# Patient Record
Sex: Male | Born: 1980 | Race: White | Hispanic: No | Marital: Single | Smoking: Current every day smoker
Health system: Southern US, Community
[De-identification: ages and names within clinical notes are randomized; demographics above are authoritative.]

---

## 2008-09-12 ENCOUNTER — Emergency Department: Payer: Self-pay | Admitting: Emergency Medicine

## 2008-10-20 ENCOUNTER — Emergency Department: Payer: Self-pay | Admitting: Emergency Medicine

## 2009-03-06 ENCOUNTER — Emergency Department: Payer: Self-pay | Admitting: Emergency Medicine

## 2009-03-15 ENCOUNTER — Emergency Department: Payer: Self-pay | Admitting: Emergency Medicine

## 2009-07-08 ENCOUNTER — Emergency Department: Payer: Self-pay | Admitting: Emergency Medicine

## 2009-07-26 ENCOUNTER — Emergency Department: Payer: Self-pay | Admitting: Internal Medicine

## 2009-11-20 ENCOUNTER — Emergency Department: Payer: Self-pay | Admitting: Emergency Medicine

## 2009-11-28 ENCOUNTER — Emergency Department: Payer: Self-pay | Admitting: Emergency Medicine

## 2010-08-04 ENCOUNTER — Emergency Department: Payer: Self-pay | Admitting: Unknown Physician Specialty

## 2010-10-26 ENCOUNTER — Emergency Department: Payer: Self-pay | Admitting: Emergency Medicine

## 2010-10-29 ENCOUNTER — Emergency Department: Payer: Self-pay | Admitting: Emergency Medicine

## 2010-12-13 ENCOUNTER — Emergency Department: Payer: Self-pay | Admitting: Emergency Medicine

## 2011-08-27 ENCOUNTER — Inpatient Hospital Stay: Payer: Self-pay | Admitting: Psychiatry

## 2011-09-12 ENCOUNTER — Emergency Department: Payer: Self-pay | Admitting: Unknown Physician Specialty

## 2011-12-06 ENCOUNTER — Emergency Department: Payer: Self-pay | Admitting: *Deleted

## 2012-01-29 ENCOUNTER — Emergency Department: Payer: Self-pay | Admitting: Internal Medicine

## 2012-01-29 LAB — URINALYSIS, COMPLETE
Bacteria: NONE SEEN
Bilirubin,UR: NEGATIVE
Hyaline Cast: 3
Ketone: NEGATIVE
Leukocyte Esterase: NEGATIVE
Nitrite: NEGATIVE
Ph: 5 (ref 4.5–8.0)
RBC,UR: 1 /HPF (ref 0–5)
Specific Gravity: 1.027 (ref 1.003–1.030)
WBC UR: 1 /HPF (ref 0–5)

## 2012-01-29 LAB — COMPREHENSIVE METABOLIC PANEL
Albumin: 2.9 g/dL — ABNORMAL LOW (ref 3.4–5.0)
Anion Gap: 11 (ref 7–16)
BUN: 11 mg/dL (ref 7–18)
Bilirubin,Total: 1.3 mg/dL — ABNORMAL HIGH (ref 0.2–1.0)
Creatinine: 1.19 mg/dL (ref 0.60–1.30)
EGFR (African American): >60
Glucose: 146 mg/dL — ABNORMAL HIGH (ref 65–99)
Osmolality: 283 (ref 275–301)
Potassium: 3.8 mmol/L (ref 3.5–5.1)
SGOT(AST): 324 U/L — ABNORMAL HIGH (ref 15–37)
SGPT (ALT): 219 U/L — ABNORMAL HIGH
Total Protein: 6.4 g/dL (ref 6.4–8.2)

## 2012-01-29 LAB — CBC
HCT: 46.2 % (ref 40.0–52.0)
HGB: 15.7 g/dL (ref 13.0–18.0)
MCH: 31.7 pg (ref 26.0–34.0)
MCHC: 34 g/dL (ref 32.0–36.0)
MCV: 93 fL (ref 80–100)
Platelet: 169 10*3/uL (ref 150–440)
RDW: 15.4 % — ABNORMAL HIGH (ref 11.5–14.5)
WBC: 3.9 10*3/uL (ref 3.8–10.6)

## 2012-02-22 ENCOUNTER — Emergency Department: Payer: Self-pay | Admitting: Emergency Medicine

## 2012-02-22 LAB — CBC
HGB: 14.1 g/dL (ref 13.0–18.0)
MCH: 31.7 pg (ref 26.0–34.0)
MCHC: 34.1 g/dL (ref 32.0–36.0)
MCV: 93 fL (ref 80–100)
Platelet: 138 10*3/uL — ABNORMAL LOW (ref 150–440)
RBC: 4.46 10*6/uL (ref 4.40–5.90)
RDW: 17 % — ABNORMAL HIGH (ref 11.5–14.5)

## 2012-02-22 LAB — COMPREHENSIVE METABOLIC PANEL
Albumin: 2.4 g/dL — ABNORMAL LOW (ref 3.4–5.0)
Alkaline Phosphatase: 778 U/L — ABNORMAL HIGH (ref 50–136)
Anion Gap: 10 (ref 7–16)
BUN: 12 mg/dL (ref 7–18)
Bilirubin,Total: 10.5 mg/dL — ABNORMAL HIGH (ref 0.2–1.0)
Calcium, Total: 8.3 mg/dL — ABNORMAL LOW (ref 8.5–10.1)
Glucose: 113 mg/dL — ABNORMAL HIGH (ref 65–99)
Osmolality: 269 (ref 275–301)
SGOT(AST): 216 U/L — ABNORMAL HIGH (ref 15–37)
SGPT (ALT): 116 U/L — ABNORMAL HIGH
Total Protein: 6.2 g/dL — ABNORMAL LOW (ref 6.4–8.2)

## 2012-02-22 LAB — APTT: Activated PTT: 32.5 secs (ref 23.6–35.9)

## 2012-02-22 LAB — LIPASE, BLOOD: Lipase: 165 U/L (ref 73–393)

## 2012-02-22 LAB — PROTIME-INR: Prothrombin Time: 12.5 secs (ref 11.5–14.7)

## 2012-02-22 LAB — CARBAMAZEPINE LEVEL, TOTAL: Carbamazepine: 12.9 ug/mL (ref 4.0–12.0)

## 2012-05-01 ENCOUNTER — Emergency Department: Payer: Self-pay | Admitting: Emergency Medicine

## 2012-05-01 LAB — URINALYSIS, COMPLETE
Blood: NEGATIVE
Granular Cast: 3
Hyaline Cast: 1
Leukocyte Esterase: NEGATIVE
Nitrite: NEGATIVE
Protein: NEGATIVE
RBC,UR: <1 /HPF (ref 0–5)
Specific Gravity: 1.027 (ref 1.003–1.030)
Squamous Epithelial: NONE SEEN
WBC UR: <1 /HPF (ref 0–5)

## 2012-05-01 LAB — CBC
HGB: 12.9 g/dL — ABNORMAL LOW (ref 13.0–18.0)
MCH: 36.5 pg — ABNORMAL HIGH (ref 26.0–34.0)
MCHC: 34.4 g/dL (ref 32.0–36.0)
MCV: 106 fL — ABNORMAL HIGH (ref 80–100)
RBC: 3.53 10*6/uL — ABNORMAL LOW (ref 4.40–5.90)
RDW: 15.7 % — ABNORMAL HIGH (ref 11.5–14.5)
WBC: 6.9 10*3/uL (ref 3.8–10.6)

## 2012-05-01 LAB — COMPREHENSIVE METABOLIC PANEL
Alkaline Phosphatase: 632 U/L — ABNORMAL HIGH (ref 50–136)
Anion Gap: 8 (ref 7–16)
BUN: 6 mg/dL — ABNORMAL LOW (ref 7–18)
Calcium, Total: 7.6 mg/dL — ABNORMAL LOW (ref 8.5–10.1)
Chloride: 103 mmol/L (ref 98–107)
Creatinine: 1.08 mg/dL (ref 0.60–1.30)
EGFR (Non-African Amer.): >60
Osmolality: 273 (ref 275–301)
Potassium: 4 mmol/L (ref 3.5–5.1)
SGOT(AST): 148 U/L — ABNORMAL HIGH (ref 15–37)
SGPT (ALT): 53 U/L
Sodium: 137 mmol/L (ref 136–145)
Total Protein: 6.6 g/dL (ref 6.4–8.2)

## 2012-05-10 ENCOUNTER — Emergency Department: Payer: Self-pay | Admitting: Internal Medicine

## 2012-05-11 ENCOUNTER — Emergency Department: Payer: Self-pay | Admitting: Emergency Medicine

## 2012-05-11 LAB — CBC
HCT: 35.9 % — ABNORMAL LOW (ref 40.0–52.0)
HGB: 12 g/dL — ABNORMAL LOW (ref 13.0–18.0)
RDW: 14.7 % — ABNORMAL HIGH (ref 11.5–14.5)
WBC: 5.7 10*3/uL (ref 3.8–10.6)

## 2012-05-11 LAB — COMPREHENSIVE METABOLIC PANEL
Albumin: 1.9 g/dL — ABNORMAL LOW (ref 3.4–5.0)
Alkaline Phosphatase: 341 U/L — ABNORMAL HIGH (ref 50–136)
Bilirubin,Total: 2 mg/dL — ABNORMAL HIGH (ref 0.2–1.0)
Calcium, Total: 7.7 mg/dL — ABNORMAL LOW (ref 8.5–10.1)
Co2: 29 mmol/L (ref 21–32)
Creatinine: 0.9 mg/dL (ref 0.60–1.30)
EGFR (Non-African Amer.): >60
Glucose: 89 mg/dL (ref 65–99)
SGOT(AST): 53 U/L — ABNORMAL HIGH (ref 15–37)
Sodium: 140 mmol/L (ref 136–145)
Total Protein: 6.2 g/dL — ABNORMAL LOW (ref 6.4–8.2)

## 2012-05-11 LAB — URINALYSIS, COMPLETE
Bilirubin,UR: NEGATIVE
Ketone: NEGATIVE
Nitrite: NEGATIVE
Ph: 5 (ref 4.5–8.0)
RBC,UR: NONE SEEN /HPF (ref 0–5)
Specific Gravity: 1.02 (ref 1.003–1.030)
Squamous Epithelial: NONE SEEN

## 2012-05-11 LAB — LIPASE, BLOOD: Lipase: 295 U/L (ref 73–393)

## 2012-12-01 ENCOUNTER — Emergency Department: Payer: Self-pay | Admitting: Emergency Medicine

## 2012-12-12 ENCOUNTER — Emergency Department: Payer: Self-pay | Admitting: Emergency Medicine

## 2012-12-12 LAB — COMPREHENSIVE METABOLIC PANEL
Albumin: 3.7 g/dL (ref 3.4–5.0)
Alkaline Phosphatase: 225 U/L — ABNORMAL HIGH (ref 50–136)
Bilirubin,Total: 0.7 mg/dL (ref 0.2–1.0)
SGPT (ALT): 57 U/L (ref 12–78)
Sodium: 140 mmol/L (ref 136–145)
Total Protein: 8.3 g/dL — ABNORMAL HIGH (ref 6.4–8.2)

## 2012-12-12 LAB — DRUG SCREEN, URINE
Barbiturates, Ur Screen: NEGATIVE (ref ?–200)
Cannabinoid 50 Ng, Ur ~~LOC~~: NEGATIVE (ref ?–50)
Cocaine Metabolite,Ur ~~LOC~~: NEGATIVE (ref ?–300)
MDMA (Ecstasy)Ur Screen: NEGATIVE (ref ?–500)
Methadone, Ur Screen: NEGATIVE (ref ?–300)
Opiate, Ur Screen: NEGATIVE (ref ?–300)
Phencyclidine (PCP) Ur S: NEGATIVE (ref ?–25)

## 2012-12-12 LAB — TSH: Thyroid Stimulating Horm: 1.22 u[IU]/mL

## 2012-12-12 LAB — CBC
MCH: 32.1 pg (ref 26.0–34.0)
MCHC: 33.8 g/dL (ref 32.0–36.0)
MCV: 95 fL (ref 80–100)
Platelet: 87 10*3/uL — ABNORMAL LOW (ref 150–440)
RDW: 13.7 % (ref 11.5–14.5)
WBC: 5.1 10*3/uL (ref 3.8–10.6)

## 2012-12-12 LAB — SALICYLATE LEVEL: Salicylates, Serum: <1.7 mg/dL

## 2012-12-12 LAB — ETHANOL: Ethanol: 401 mg/dL

## 2012-12-12 LAB — ACETAMINOPHEN LEVEL: Acetaminophen: <2 ug/mL

## 2012-12-13 ENCOUNTER — Emergency Department: Payer: Self-pay | Admitting: Emergency Medicine

## 2012-12-13 LAB — CBC
HGB: 15 g/dL (ref 13.0–18.0)
MCV: 96 fL (ref 80–100)
Platelet: 81 10*3/uL — ABNORMAL LOW (ref 150–440)
RBC: 4.53 10*6/uL (ref 4.40–5.90)
WBC: 4.6 10*3/uL (ref 3.8–10.6)

## 2012-12-13 LAB — DRUG SCREEN, URINE
Amphetamines, Ur Screen: NEGATIVE (ref ?–1000)
Barbiturates, Ur Screen: NEGATIVE (ref ?–200)
Cannabinoid 50 Ng, Ur ~~LOC~~: NEGATIVE (ref ?–50)
Cocaine Metabolite,Ur ~~LOC~~: NEGATIVE (ref ?–300)
MDMA (Ecstasy)Ur Screen: NEGATIVE (ref ?–500)
Opiate, Ur Screen: NEGATIVE (ref ?–300)
Phencyclidine (PCP) Ur S: NEGATIVE (ref ?–25)

## 2012-12-13 LAB — COMPREHENSIVE METABOLIC PANEL
Anion Gap: 10 (ref 7–16)
Calcium, Total: 8.5 mg/dL (ref 8.5–10.1)
Chloride: 102 mmol/L (ref 98–107)
Co2: 27 mmol/L (ref 21–32)
EGFR (Non-African Amer.): >60
Osmolality: 275 (ref 275–301)
Potassium: 3.7 mmol/L (ref 3.5–5.1)
Sodium: 139 mmol/L (ref 136–145)

## 2012-12-13 LAB — ETHANOL
Ethanol %: 0.089 % — ABNORMAL HIGH (ref 0.000–0.080)
Ethanol: 89 mg/dL

## 2012-12-13 LAB — ACETAMINOPHEN LEVEL: Acetaminophen: <2 ug/mL

## 2012-12-13 LAB — TSH: Thyroid Stimulating Horm: 0.826 u[IU]/mL

## 2012-12-14 ENCOUNTER — Emergency Department: Payer: Self-pay | Admitting: Emergency Medicine

## 2013-02-08 ENCOUNTER — Emergency Department: Payer: Self-pay | Admitting: Internal Medicine

## 2013-02-08 LAB — COMPREHENSIVE METABOLIC PANEL
Albumin: 4.3 g/dL (ref 3.4–5.0)
Alkaline Phosphatase: 156 U/L — ABNORMAL HIGH (ref 50–136)
Bilirubin,Total: 1.1 mg/dL — ABNORMAL HIGH (ref 0.2–1.0)
Chloride: 101 mmol/L (ref 98–107)
Creatinine: 0.78 mg/dL (ref 0.60–1.30)
EGFR (African American): >60
Osmolality: 270 (ref 275–301)
SGPT (ALT): 31 U/L (ref 12–78)
Total Protein: 8.6 g/dL — ABNORMAL HIGH (ref 6.4–8.2)

## 2013-02-08 LAB — DRUG SCREEN, URINE
Amphetamines, Ur Screen: NEGATIVE (ref ?–1000)
Cannabinoid 50 Ng, Ur ~~LOC~~: NEGATIVE (ref ?–50)
MDMA (Ecstasy)Ur Screen: NEGATIVE (ref ?–500)
Methadone, Ur Screen: NEGATIVE (ref ?–300)
Opiate, Ur Screen: NEGATIVE (ref ?–300)
Phencyclidine (PCP) Ur S: NEGATIVE (ref ?–25)

## 2013-02-08 LAB — CBC
MCH: 32.7 pg (ref 26.0–34.0)
MCHC: 34 g/dL (ref 32.0–36.0)
RBC: 4.84 10*6/uL (ref 4.40–5.90)
WBC: 10 10*3/uL (ref 3.8–10.6)

## 2013-02-08 LAB — SALICYLATE LEVEL: Salicylates, Serum: 2.4 mg/dL

## 2013-02-08 LAB — ETHANOL: Ethanol: <3 mg/dL

## 2013-05-29 ENCOUNTER — Emergency Department: Payer: Self-pay | Admitting: Internal Medicine

## 2013-05-29 LAB — COMPREHENSIVE METABOLIC PANEL
Albumin: 4 g/dL (ref 3.4–5.0)
Alkaline Phosphatase: 246 U/L — ABNORMAL HIGH (ref 50–136)
BUN: 6 mg/dL — ABNORMAL LOW (ref 7–18)
Bilirubin,Total: 1.1 mg/dL — ABNORMAL HIGH (ref 0.2–1.0)
Calcium, Total: 9 mg/dL (ref 8.5–10.1)
Chloride: 103 mmol/L (ref 98–107)
Co2: 26 mmol/L (ref 21–32)
Creatinine: 0.62 mg/dL (ref 0.60–1.30)
EGFR (African American): >60
Glucose: 101 mg/dL — ABNORMAL HIGH (ref 65–99)
SGOT(AST): 88 U/L — ABNORMAL HIGH (ref 15–37)
Sodium: 137 mmol/L (ref 136–145)
Total Protein: 8.4 g/dL — ABNORMAL HIGH (ref 6.4–8.2)

## 2013-05-29 LAB — DRUG SCREEN, URINE
Amphetamines, Ur Screen: NEGATIVE (ref ?–1000)
Barbiturates, Ur Screen: NEGATIVE (ref ?–200)
Benzodiazepine, Ur Scrn: NEGATIVE (ref ?–200)
Cannabinoid 50 Ng, Ur ~~LOC~~: NEGATIVE (ref ?–50)
MDMA (Ecstasy)Ur Screen: NEGATIVE (ref ?–500)
Opiate, Ur Screen: NEGATIVE (ref ?–300)
Tricyclic, Ur Screen: NEGATIVE (ref ?–1000)

## 2013-05-29 LAB — TSH: Thyroid Stimulating Horm: 1.05 u[IU]/mL

## 2013-05-29 LAB — CBC
HCT: 45.6 % (ref 40.0–52.0)
HGB: 15.8 g/dL (ref 13.0–18.0)
MCH: 33 pg (ref 26.0–34.0)
MCV: 96 fL (ref 80–100)
Platelet: 135 10*3/uL — ABNORMAL LOW (ref 150–440)
RBC: 4.78 10*6/uL (ref 4.40–5.90)
RDW: 13.7 % (ref 11.5–14.5)
WBC: 8.9 10*3/uL (ref 3.8–10.6)

## 2013-05-29 LAB — ETHANOL
Ethanol %: 0.068 % (ref 0.000–0.080)
Ethanol: 68 mg/dL

## 2013-05-29 LAB — ACETAMINOPHEN LEVEL: Acetaminophen: <2 ug/mL

## 2013-05-29 LAB — SALICYLATE LEVEL: Salicylates, Serum: 3.4 mg/dL — ABNORMAL HIGH

## 2013-05-30 LAB — ETHANOL: Ethanol %: <0.003 % (ref 0.000–0.080)

## 2013-05-30 LAB — DRUG SCREEN, URINE
Amphetamines, Ur Screen: NEGATIVE (ref ?–1000)
Barbiturates, Ur Screen: NEGATIVE (ref ?–200)
Cannabinoid 50 Ng, Ur ~~LOC~~: NEGATIVE (ref ?–50)
MDMA (Ecstasy)Ur Screen: NEGATIVE (ref ?–500)
Methadone, Ur Screen: NEGATIVE (ref ?–300)
Opiate, Ur Screen: NEGATIVE (ref ?–300)

## 2013-05-30 LAB — CBC
HCT: 45.1 % (ref 40.0–52.0)
MCHC: 34.1 g/dL (ref 32.0–36.0)
Platelet: 143 10*3/uL — ABNORMAL LOW (ref 150–440)
WBC: 8.2 10*3/uL (ref 3.8–10.6)

## 2013-05-30 LAB — COMPREHENSIVE METABOLIC PANEL
Albumin: 4.1 g/dL (ref 3.4–5.0)
Alkaline Phosphatase: 212 U/L — ABNORMAL HIGH (ref 50–136)
BUN: 7 mg/dL (ref 7–18)
Calcium, Total: 9.5 mg/dL (ref 8.5–10.1)
EGFR (African American): >60
EGFR (Non-African Amer.): >60
SGOT(AST): 65 U/L — ABNORMAL HIGH (ref 15–37)
SGPT (ALT): 62 U/L (ref 12–78)
Total Protein: 8.2 g/dL (ref 6.4–8.2)

## 2013-05-30 LAB — URINALYSIS, COMPLETE
Bilirubin,UR: NEGATIVE
Glucose,UR: NEGATIVE mg/dL (ref 0–75)
Nitrite: NEGATIVE
Ph: 5 (ref 4.5–8.0)
RBC,UR: 1 /HPF (ref 0–5)
Squamous Epithelial: NONE SEEN
WBC UR: 2 /HPF (ref 0–5)

## 2013-05-31 LAB — BASIC METABOLIC PANEL
Anion Gap: 5 — ABNORMAL LOW (ref 7–16)
BUN: 8 mg/dL (ref 7–18)
Calcium, Total: 9.2 mg/dL (ref 8.5–10.1)
Chloride: 106 mmol/L (ref 98–107)
Co2: 28 mmol/L (ref 21–32)
Creatinine: 0.81 mg/dL (ref 0.60–1.30)
EGFR (African American): >60
EGFR (Non-African Amer.): >60
Glucose: 86 mg/dL (ref 65–99)
Osmolality: 275 (ref 275–301)
Potassium: 4.1 mmol/L (ref 3.5–5.1)
Sodium: 139 mmol/L (ref 136–145)

## 2013-06-01 ENCOUNTER — Inpatient Hospital Stay: Payer: Self-pay | Admitting: Internal Medicine

## 2013-06-02 LAB — BASIC METABOLIC PANEL WITH GFR
Anion Gap: 7
BUN: 9 mg/dL
Calcium, Total: 8.7 mg/dL
Chloride: 103 mmol/L
Co2: 28 mmol/L
Creatinine: 0.94 mg/dL
EGFR (African American): >60
EGFR (Non-African Amer.): >60
Glucose: 160 mg/dL — ABNORMAL HIGH
Osmolality: 278
Potassium: 3.8 mmol/L
Sodium: 138 mmol/L

## 2013-06-02 LAB — CBC WITH DIFFERENTIAL/PLATELET
Basophil #: 0.1 x10 3/mm 3
Basophil %: 1.3 %
Eosinophil #: 0.3 x10 3/mm 3
Eosinophil %: 2.7 %
HCT: 46.8 %
HGB: 15.8 g/dL
Lymphocyte %: 10.3 %
Lymphs Abs: 1.2 x10 3/mm 3
MCH: 32.9 pg
MCHC: 33.7 g/dL
MCV: 98 fL
Monocyte #: 1.5 "x10 3/mm " — ABNORMAL HIGH
Monocyte %: 13 %
Neutrophil #: 8.3 x10 3/mm 3 — ABNORMAL HIGH
Neutrophil %: 72.7 %
Platelet: 140 x10 3/mm 3 — ABNORMAL LOW
RBC: 4.8 x10 6/mm 3
RDW: 13.2 %
WBC: 11.3 x10 3/mm 3 — ABNORMAL HIGH

## 2013-06-02 LAB — COMPREHENSIVE METABOLIC PANEL WITH GFR
Albumin: 3.5 g/dL
Alkaline Phosphatase: 160 U/L — ABNORMAL HIGH
Bilirubin,Total: 1.2 mg/dL — ABNORMAL HIGH
SGOT(AST): 45 U/L — ABNORMAL HIGH
SGPT (ALT): 43 U/L
Total Protein: 7.2 g/dL

## 2013-06-02 LAB — PROTIME-INR
INR: 1.1
Prothrombin Time: 14.2 secs (ref 11.5–14.7)

## 2013-06-02 LAB — AMMONIA: Ammonia, Plasma: 45 umol/L — ABNORMAL HIGH

## 2013-06-04 ENCOUNTER — Emergency Department: Payer: Self-pay | Admitting: Emergency Medicine

## 2013-06-04 LAB — COMPREHENSIVE METABOLIC PANEL
Alkaline Phosphatase: 193 U/L — ABNORMAL HIGH (ref 50–136)
Anion Gap: 3 — ABNORMAL LOW (ref 7–16)
BUN: 9 mg/dL (ref 7–18)
Bilirubin,Total: 0.5 mg/dL (ref 0.2–1.0)
Calcium, Total: 9.1 mg/dL (ref 8.5–10.1)
Chloride: 103 mmol/L (ref 98–107)
Co2: 29 mmol/L (ref 21–32)
Creatinine: 0.88 mg/dL (ref 0.60–1.30)
EGFR (Non-African Amer.): >60
Osmolality: 271 (ref 275–301)
Potassium: 4 mmol/L (ref 3.5–5.1)
SGOT(AST): 43 U/L — ABNORMAL HIGH (ref 15–37)
SGPT (ALT): 51 U/L (ref 12–78)
Total Protein: 7.5 g/dL (ref 6.4–8.2)

## 2013-06-04 LAB — URINALYSIS, COMPLETE
Blood: NEGATIVE
Ketone: NEGATIVE
Leukocyte Esterase: NEGATIVE
Specific Gravity: 1.029 (ref 1.003–1.030)

## 2013-06-04 LAB — DRUG SCREEN, URINE
Barbiturates, Ur Screen: NEGATIVE (ref ?–200)
Cannabinoid 50 Ng, Ur ~~LOC~~: NEGATIVE (ref ?–50)
Cocaine Metabolite,Ur ~~LOC~~: NEGATIVE (ref ?–300)
MDMA (Ecstasy)Ur Screen: NEGATIVE (ref ?–500)
Opiate, Ur Screen: NEGATIVE (ref ?–300)
Phencyclidine (PCP) Ur S: NEGATIVE (ref ?–25)
Tricyclic, Ur Screen: NEGATIVE (ref ?–1000)

## 2013-06-04 LAB — CBC
HCT: 45 % (ref 40.0–52.0)
HGB: 15.1 g/dL (ref 13.0–18.0)
MCH: 33 pg (ref 26.0–34.0)
MCHC: 33.6 g/dL (ref 32.0–36.0)
MCV: 98 fL (ref 80–100)

## 2013-06-04 LAB — ETHANOL: Ethanol: <3 mg/dL

## 2013-06-23 ENCOUNTER — Emergency Department: Payer: Self-pay | Admitting: Emergency Medicine

## 2013-07-17 ENCOUNTER — Emergency Department: Payer: Self-pay | Admitting: Emergency Medicine

## 2013-08-12 ENCOUNTER — Emergency Department: Payer: Self-pay | Admitting: Emergency Medicine

## 2013-08-12 LAB — DRUG SCREEN, URINE
Benzodiazepine, Ur Scrn: NEGATIVE (ref ?–200)
Cannabinoid 50 Ng, Ur ~~LOC~~: NEGATIVE (ref ?–50)
Cocaine Metabolite,Ur ~~LOC~~: NEGATIVE (ref ?–300)
MDMA (Ecstasy)Ur Screen: NEGATIVE (ref ?–500)
Methadone, Ur Screen: NEGATIVE (ref ?–300)
Opiate, Ur Screen: NEGATIVE (ref ?–300)
Tricyclic, Ur Screen: NEGATIVE (ref ?–1000)

## 2013-08-12 LAB — ETHANOL: Ethanol: 367 mg/dL

## 2013-08-12 LAB — COMPREHENSIVE METABOLIC PANEL
Albumin: 4.5 g/dL (ref 3.4–5.0)
Alkaline Phosphatase: 190 U/L — ABNORMAL HIGH (ref 50–136)
BUN: 4 mg/dL — ABNORMAL LOW (ref 7–18)
Chloride: 103 mmol/L (ref 98–107)
Creatinine: 0.86 mg/dL (ref 0.60–1.30)
EGFR (Non-African Amer.): >60
Glucose: 102 mg/dL — ABNORMAL HIGH (ref 65–99)
Osmolality: 276 (ref 275–301)
SGOT(AST): 70 U/L — ABNORMAL HIGH (ref 15–37)
SGPT (ALT): 50 U/L (ref 12–78)
Sodium: 140 mmol/L (ref 136–145)

## 2013-08-12 LAB — URINALYSIS, COMPLETE
Bilirubin,UR: NEGATIVE
Blood: NEGATIVE
Glucose,UR: NEGATIVE mg/dL (ref 0–75)
Leukocyte Esterase: NEGATIVE
Protein: NEGATIVE
Specific Gravity: 1.005 (ref 1.003–1.030)
WBC UR: <1 /HPF (ref 0–5)

## 2013-08-12 LAB — CBC
MCV: 92 fL (ref 80–100)
Platelet: 269 10*3/uL (ref 150–440)
RBC: 5.61 10*6/uL (ref 4.40–5.90)
RDW: 13.7 % (ref 11.5–14.5)
WBC: 7.8 10*3/uL (ref 3.8–10.6)

## 2013-08-13 LAB — ETHANOL: Ethanol: 164 mg/dL

## 2013-09-29 ENCOUNTER — Emergency Department: Payer: Self-pay | Admitting: Emergency Medicine

## 2013-10-05 ENCOUNTER — Emergency Department: Payer: Self-pay | Admitting: Emergency Medicine

## 2013-10-05 LAB — URINALYSIS, COMPLETE
Bilirubin,UR: NEGATIVE
Blood: NEGATIVE
Nitrite: NEGATIVE
Protein: NEGATIVE
WBC UR: <1 /HPF (ref 0–5)

## 2013-10-05 LAB — COMPREHENSIVE METABOLIC PANEL
Alkaline Phosphatase: 239 U/L — ABNORMAL HIGH (ref 50–136)
Anion Gap: 4 — ABNORMAL LOW (ref 7–16)
BUN: 7 mg/dL (ref 7–18)
Bilirubin,Total: 0.4 mg/dL (ref 0.2–1.0)
Calcium, Total: 9.1 mg/dL (ref 8.5–10.1)
Chloride: 102 mmol/L (ref 98–107)
Co2: 30 mmol/L (ref 21–32)
Creatinine: 0.67 mg/dL (ref 0.60–1.30)
EGFR (African American): >60
EGFR (Non-African Amer.): >60
Glucose: 107 mg/dL — ABNORMAL HIGH (ref 65–99)
SGOT(AST): 70 U/L — ABNORMAL HIGH (ref 15–37)
SGPT (ALT): 55 U/L (ref 12–78)

## 2013-10-05 LAB — ETHANOL
Ethanol %: 0.215 % — ABNORMAL HIGH (ref 0.000–0.080)
Ethanol: 215 mg/dL

## 2013-10-05 LAB — DRUG SCREEN, URINE

## 2013-10-05 LAB — CBC
HCT: 47.6 % (ref 40.0–52.0)
MCHC: 34.4 g/dL (ref 32.0–36.0)
Platelet: 181 10*3/uL (ref 150–440)
RBC: 5.08 10*6/uL (ref 4.40–5.90)
RDW: 14.4 % (ref 11.5–14.5)

## 2013-10-17 ENCOUNTER — Inpatient Hospital Stay: Payer: Self-pay | Admitting: Psychiatry

## 2013-10-17 LAB — DRUG SCREEN, URINE
Barbiturates, Ur Screen: NEGATIVE (ref ?–200)
Benzodiazepine, Ur Scrn: NEGATIVE (ref ?–200)
Cannabinoid 50 Ng, Ur ~~LOC~~: NEGATIVE (ref ?–50)
Cocaine Metabolite,Ur ~~LOC~~: NEGATIVE (ref ?–300)
MDMA (Ecstasy)Ur Screen: NEGATIVE (ref ?–500)
Methadone, Ur Screen: NEGATIVE (ref ?–300)
Opiate, Ur Screen: POSITIVE (ref ?–300)
Phencyclidine (PCP) Ur S: NEGATIVE (ref ?–25)
Tricyclic, Ur Screen: NEGATIVE (ref ?–1000)

## 2013-10-17 LAB — CBC
HCT: 38 % — ABNORMAL LOW (ref 40.0–52.0)
HGB: 13.1 g/dL (ref 13.0–18.0)
MCHC: 34.4 g/dL (ref 32.0–36.0)
MCV: 94 fL (ref 80–100)
RBC: 4.05 10*6/uL — ABNORMAL LOW (ref 4.40–5.90)
RDW: 13.9 % (ref 11.5–14.5)
WBC: 7.2 10*3/uL (ref 3.8–10.6)

## 2013-10-17 LAB — COMPREHENSIVE METABOLIC PANEL
Albumin: 3.4 g/dL (ref 3.4–5.0)
Calcium, Total: 8.8 mg/dL (ref 8.5–10.1)
Chloride: 96 mmol/L — ABNORMAL LOW (ref 98–107)
Co2: 27 mmol/L (ref 21–32)
EGFR (African American): >60
Osmolality: 253 (ref 275–301)
Potassium: 3.7 mmol/L (ref 3.5–5.1)
SGOT(AST): 94 U/L — ABNORMAL HIGH (ref 15–37)
SGPT (ALT): 61 U/L (ref 12–78)
Sodium: 127 mmol/L — ABNORMAL LOW (ref 136–145)
Total Protein: 7.9 g/dL (ref 6.4–8.2)

## 2013-10-17 LAB — ETHANOL: Ethanol: <3 mg/dL

## 2013-10-17 LAB — BASIC METABOLIC PANEL
BUN: 9 mg/dL (ref 7–18)
Calcium, Total: 8.4 mg/dL — ABNORMAL LOW (ref 8.5–10.1)
Chloride: 100 mmol/L (ref 98–107)
Creatinine: 0.79 mg/dL (ref 0.60–1.30)
EGFR (African American): >60
EGFR (Non-African Amer.): >60
Glucose: 106 mg/dL — ABNORMAL HIGH (ref 65–99)
Osmolality: 264 (ref 275–301)
Potassium: 3.6 mmol/L (ref 3.5–5.1)
Sodium: 132 mmol/L — ABNORMAL LOW (ref 136–145)

## 2013-10-17 LAB — URINALYSIS, COMPLETE
Blood: NEGATIVE
Glucose,UR: NEGATIVE mg/dL (ref 0–75)
Leukocyte Esterase: NEGATIVE
Nitrite: NEGATIVE
Ph: 7 (ref 4.5–8.0)
Specific Gravity: 1.024 (ref 1.003–1.030)

## 2013-10-17 LAB — TSH: Thyroid Stimulating Horm: 0.29 u[IU]/mL — ABNORMAL LOW

## 2013-10-17 LAB — MAGNESIUM: Magnesium: 1.8 mg/dL

## 2013-10-18 LAB — COMPREHENSIVE METABOLIC PANEL
Alkaline Phosphatase: 364 U/L — ABNORMAL HIGH (ref 50–136)
Anion Gap: 5 — ABNORMAL LOW (ref 7–16)
BUN: 7 mg/dL (ref 7–18)
Co2: 24 mmol/L (ref 21–32)
Creatinine: 0.88 mg/dL (ref 0.60–1.30)
EGFR (African American): >60
EGFR (Non-African Amer.): >60
Potassium: 4 mmol/L (ref 3.5–5.1)
SGOT(AST): 92 U/L — ABNORMAL HIGH (ref 15–37)
Sodium: 136 mmol/L (ref 136–145)
Total Protein: 7.7 g/dL (ref 6.4–8.2)

## 2014-12-06 IMAGING — US ABDOMEN ULTRASOUND LIMITED
1 series · 14 of 25 positions shown · non-contrast
Comparison: none

REASON FOR EXAM: elevated LFTs
COMMENTS:   Body Site: Right Upper Quad; Liver

[Series 1: abdomen ultrasound limited · 0.23mm/px · 14 of 62 slices shown]
[im 1/62]
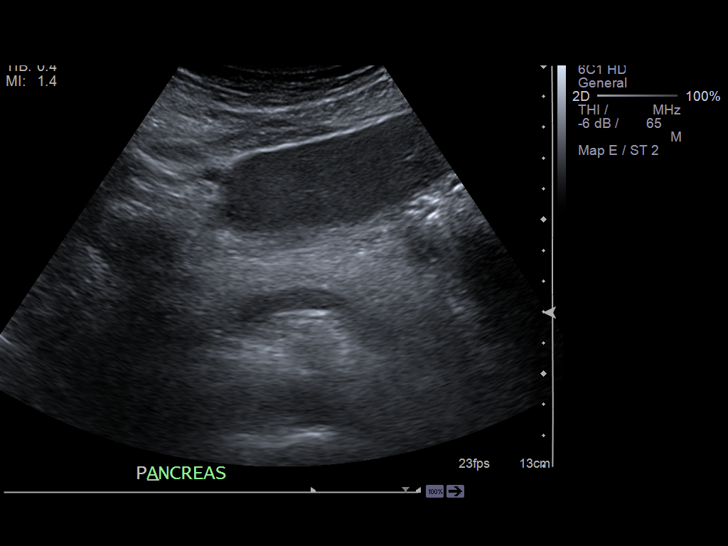
[im 6/62]
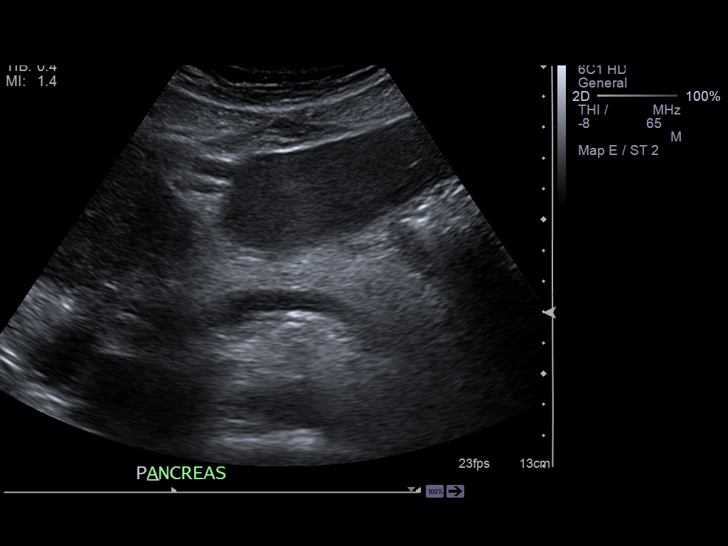
[im 11/62]
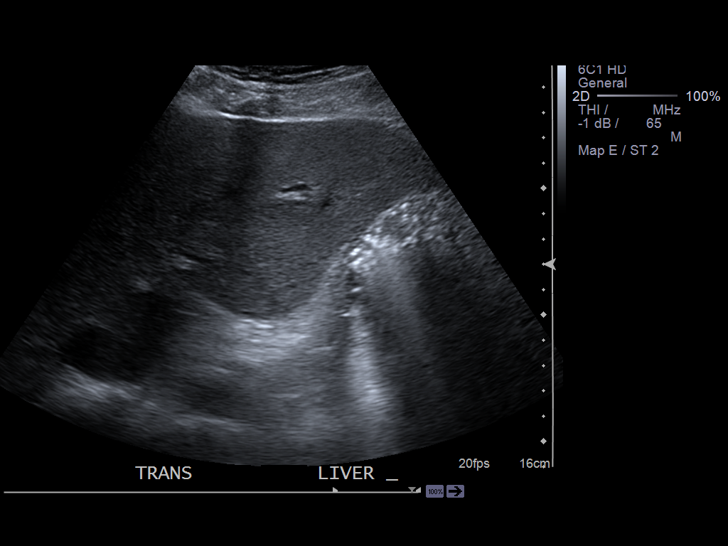
[im 16/62]
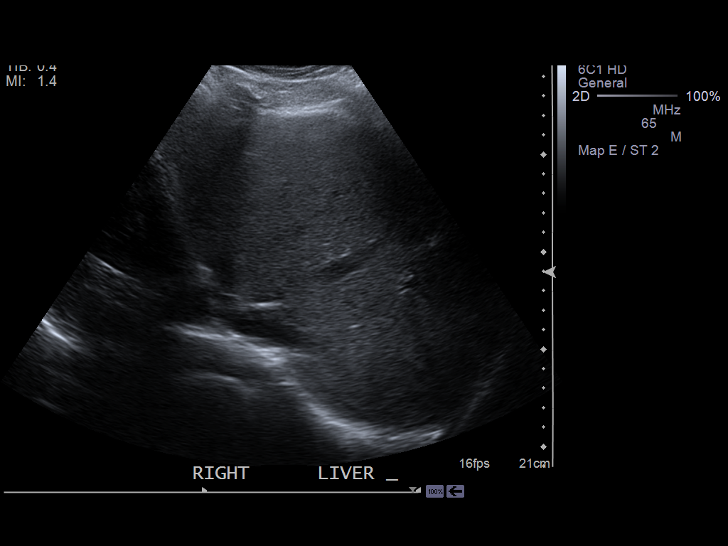
[im 21/62]
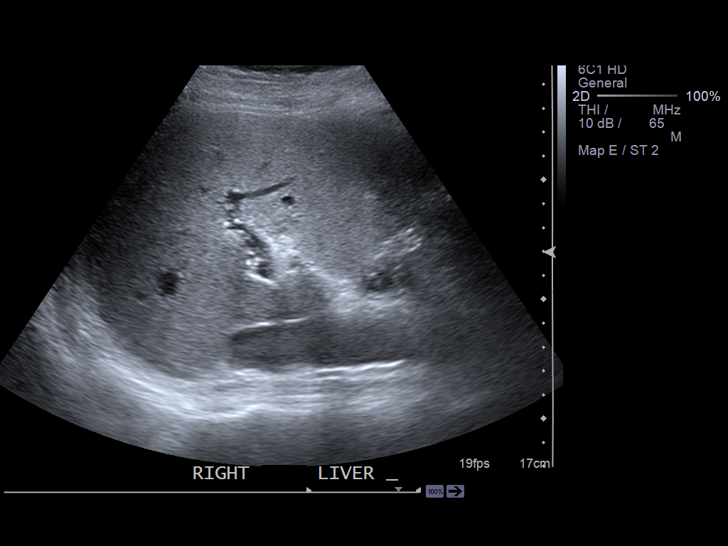
[im 23/62]
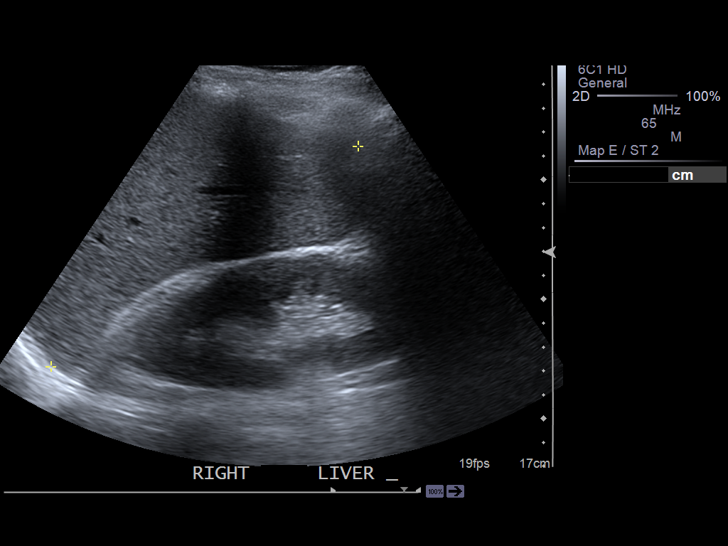
[im 28/62]
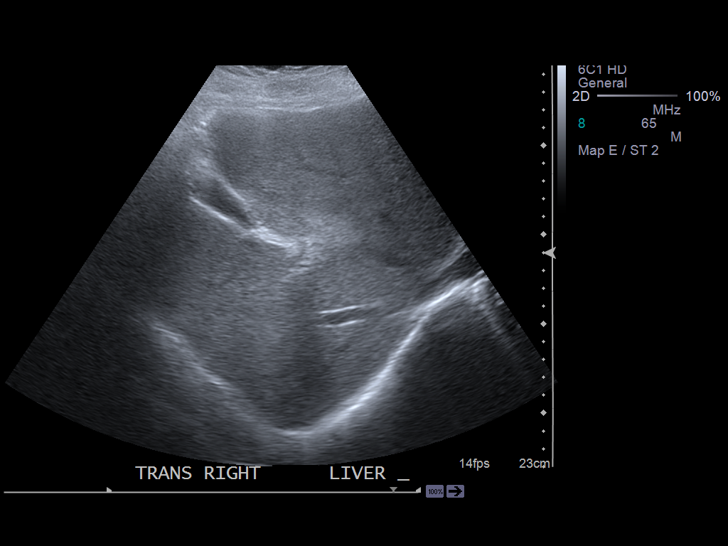
[im 34/62]
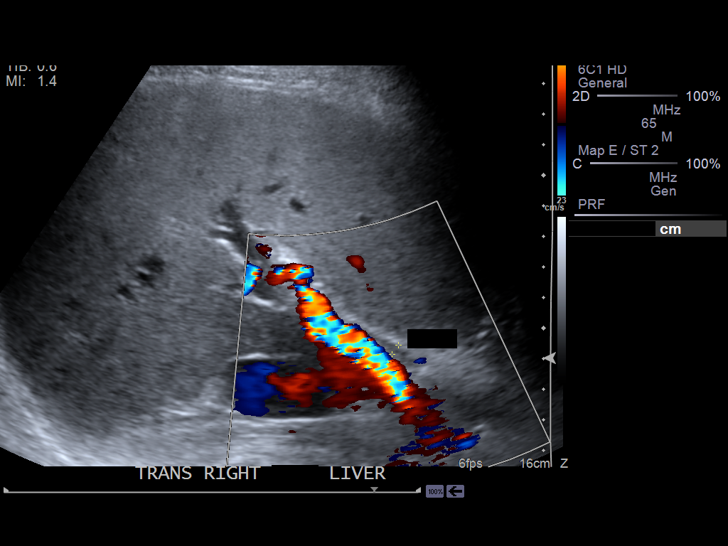
[im 39/62]
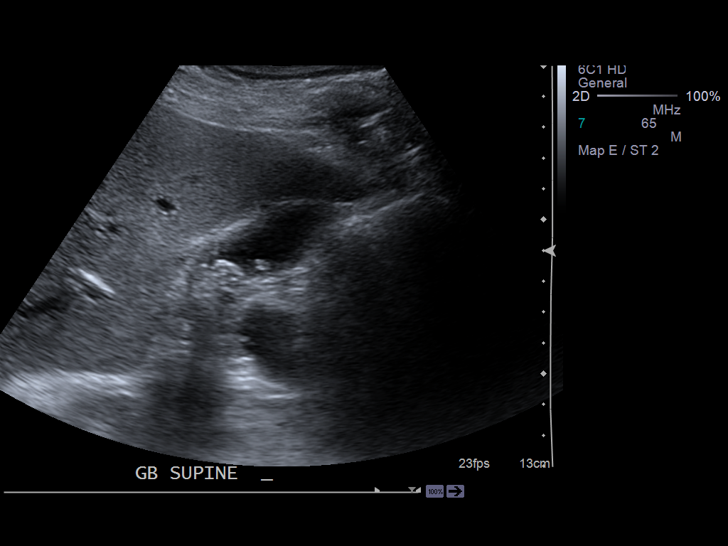
[im 41/62]
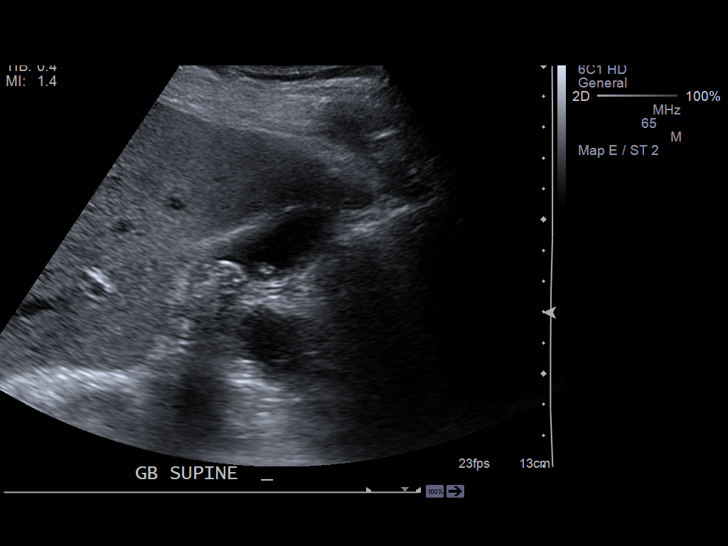
[im 46/62]
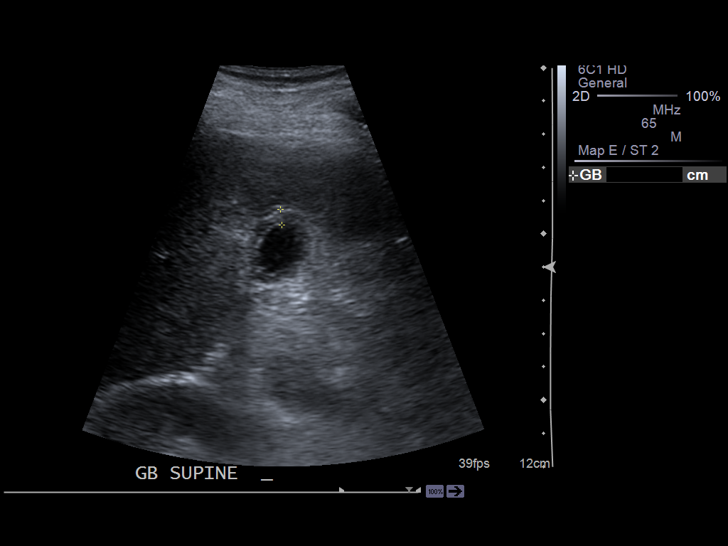
[im 51/62]
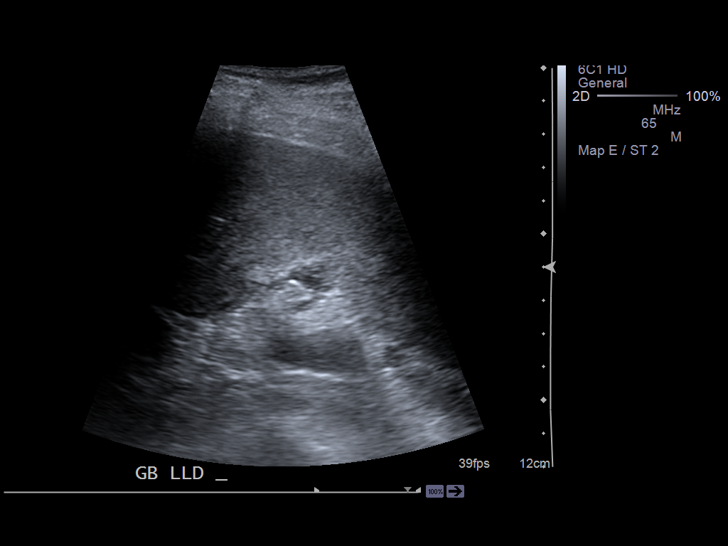
[im 56/62]
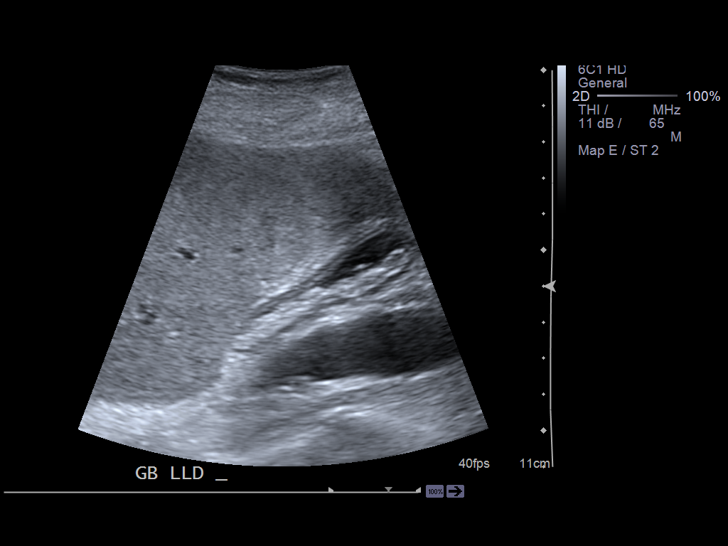
[im 62/62]
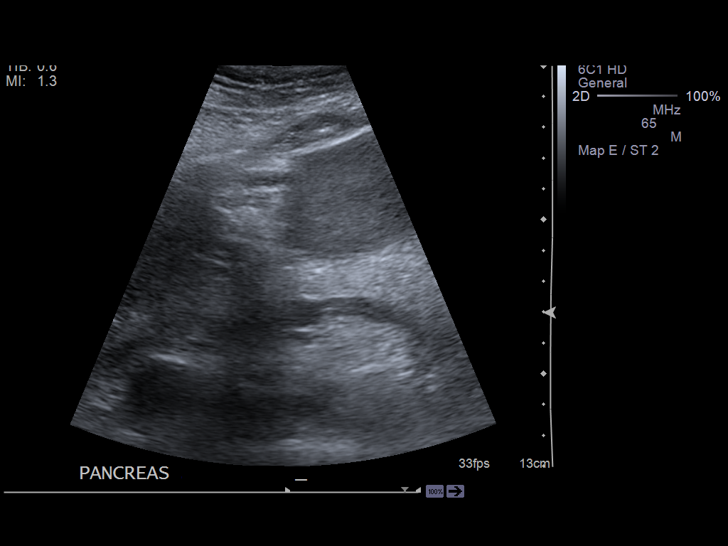

[14 of 25 positions shown; findings below may reference images not displayed]

PROCEDURE:     US  - US ABDOMEN LIMITED SURVEY  - June 02, 2013 [DATE]

RESULT:     History: Elevated LFTs.

Comparison Study: Prior ultrasound of 02/22/2012. The liver normal. Portal
vein patent. Large amount of sludge present in the gallbladder. Nonshadowing
stones cannot be excluded. Thickened gallbladder wall at 4.7 mm. Common bile
duct caliber 3.7 mm. Visualized pancreas unremarkable.
IMPRESSION: Large amount of sludge noted within the gallbladder.
Nonshadowing stones cannot be excluded. Thickened gallbladder wall present.
These findings suggest the presence of cholecystitis.

## 2014-12-06 IMAGING — CT CT HEAD WITHOUT CONTRAST
1 series · 16 of 30 positions shown, 20 images · non-contrast
Comparison: none

REASON FOR EXAM: AMS
COMMENTS:

PROCEDURE:     CT  - CT HEAD WITHOUT CONTRAST  - June 02, 2013 [DATE]
RESULT:     History: Altered mental status.
Comparison Study: Head CT of 03/06/2009.

[Series 2: soft tissue · axial · 0.42mm/px · z∈[-109,+31]mm · 16 of 32 slices shown, 20 images]
[im 2/32  brain]
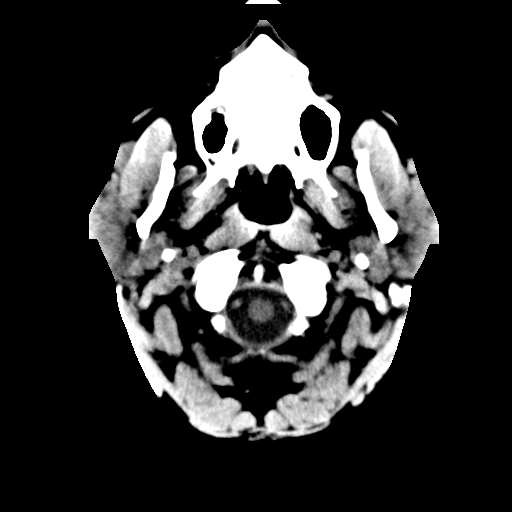
[im 2/32  bone]
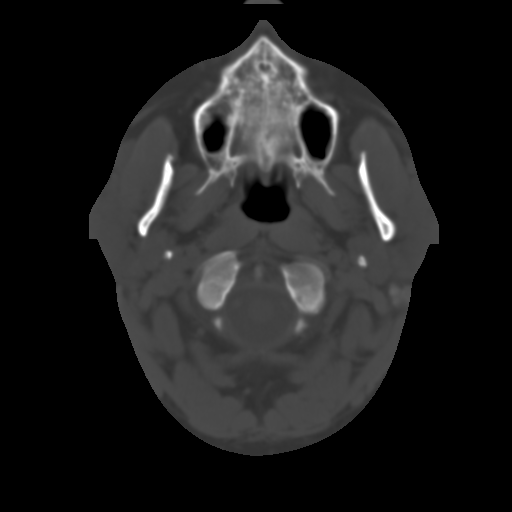
[im 4/32  brain]
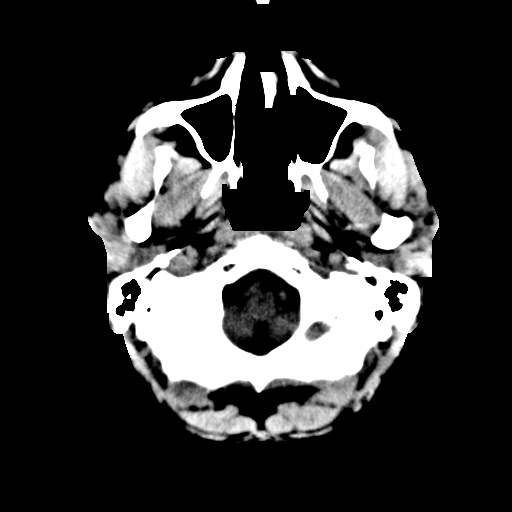
[im 6/32  brain]
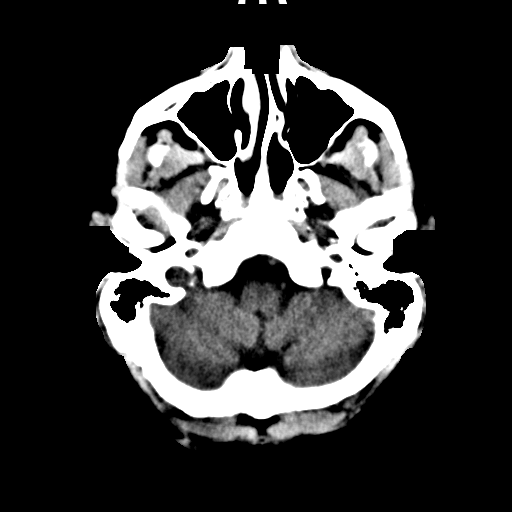
[im 8/32  brain]
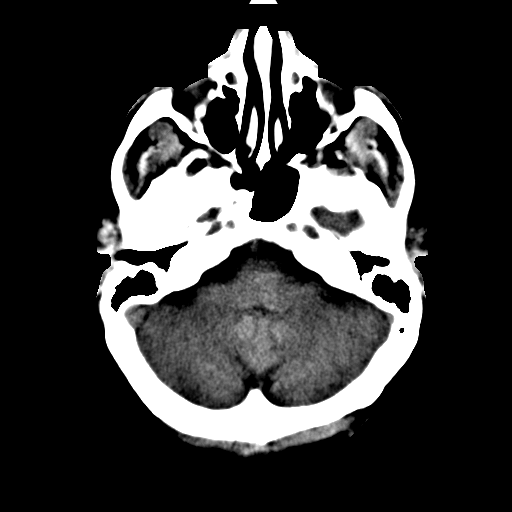
[im 9/32  brain]
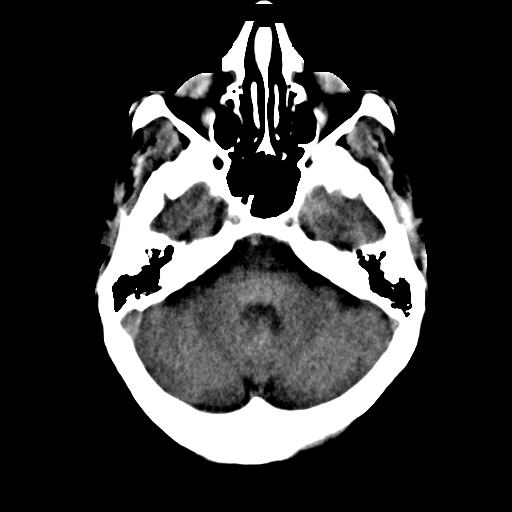
[im 9/32  bone]
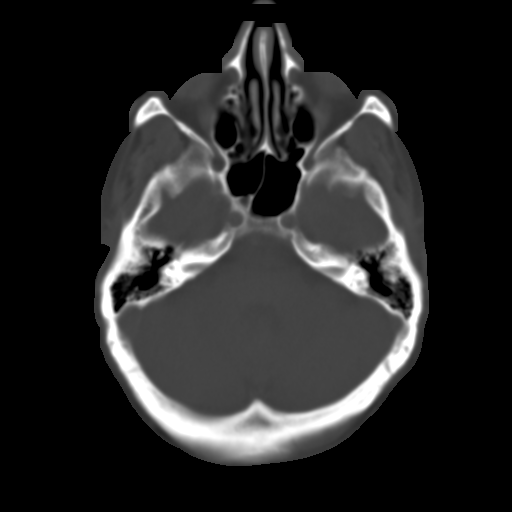
[im 11/32  brain]
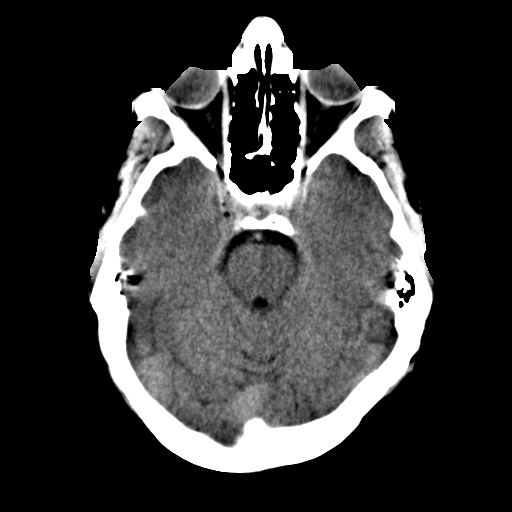
[im 13/32  brain]
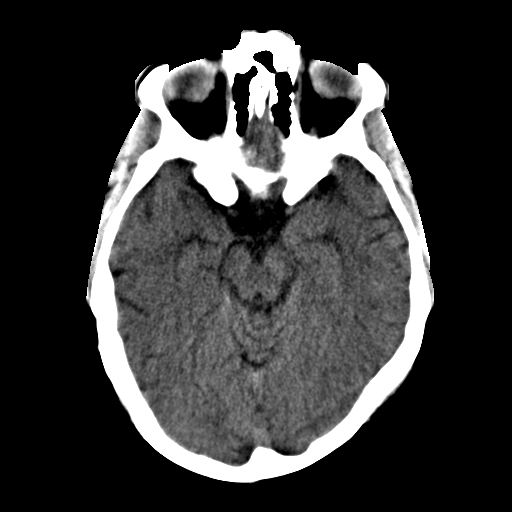
[im 15/32  brain]
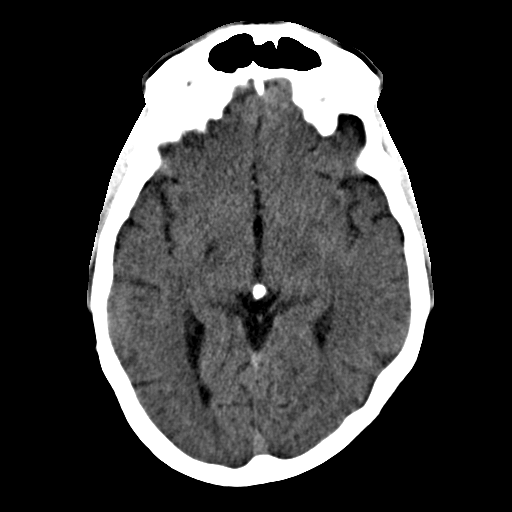
[im 17/32  brain]
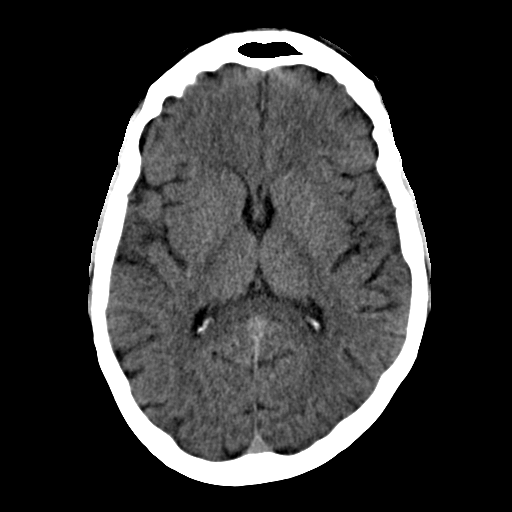
[im 17/32  bone]
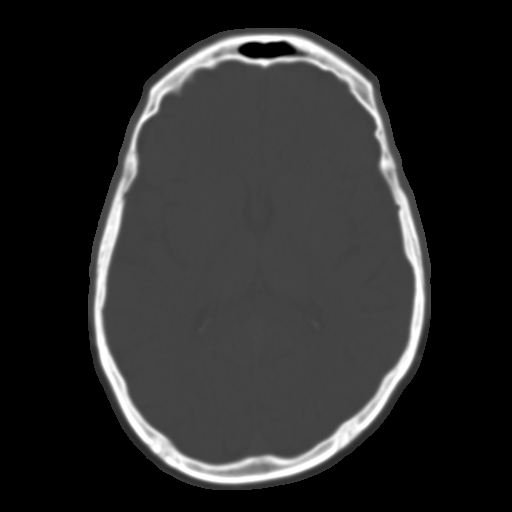
[im 19/32  brain]
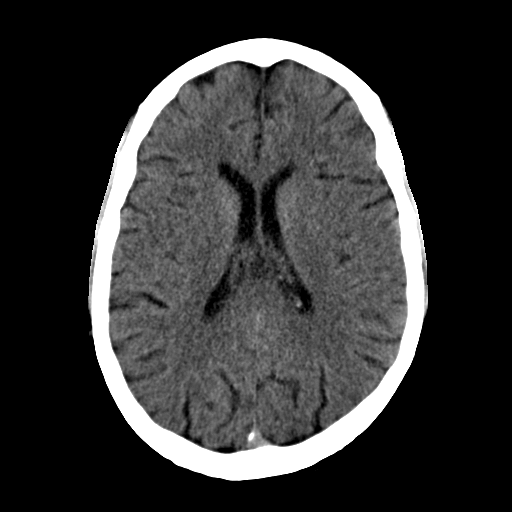
[im 21/32  brain]
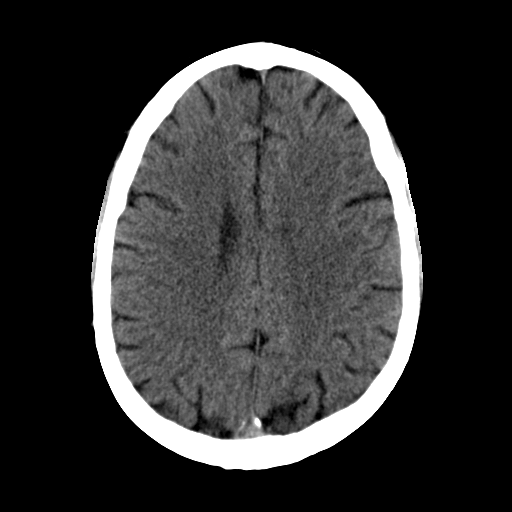
[im 23/32  brain]
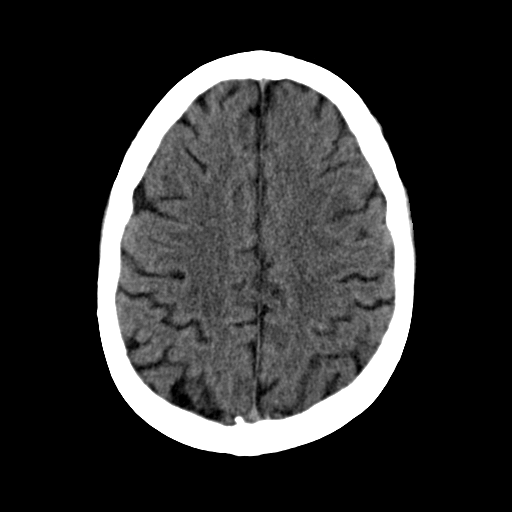
[im 24/32  brain]
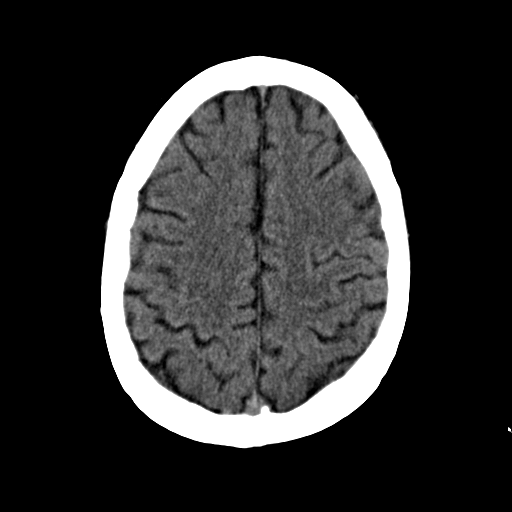
[im 24/32  bone]
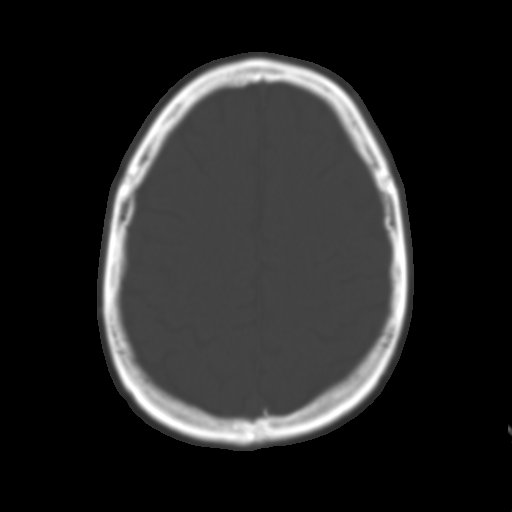
[im 26/32  brain]
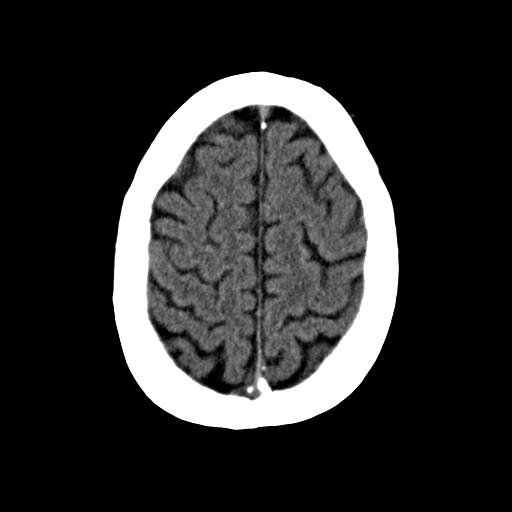
[im 28/32  brain]
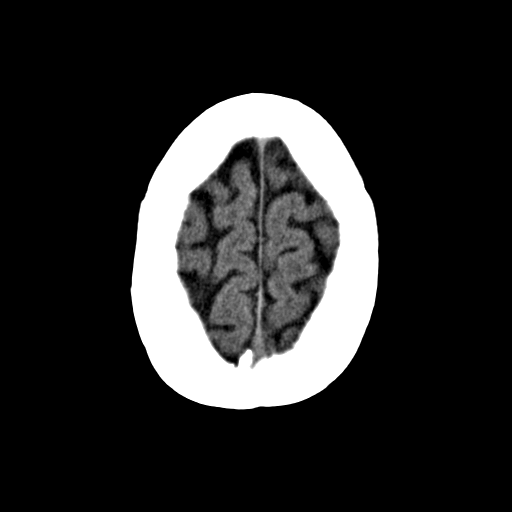
[im 30/32  brain]
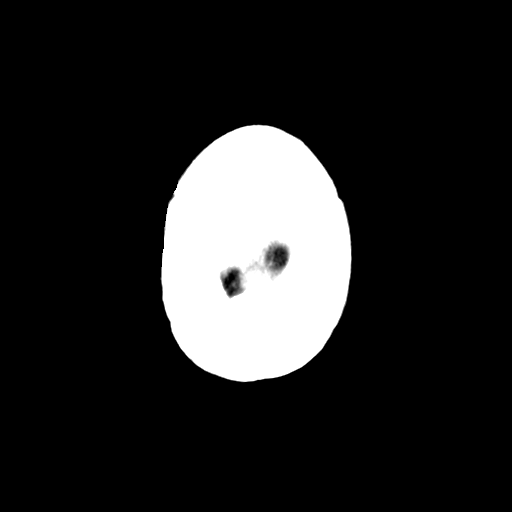

[16 of 30 positions shown; findings below may reference images not displayed]

FINDINGS: Nonenhanced exam obtained .No mass. No hydrocephalus. No
hemorrhage. No acute bony abnormality.
IMPRESSION: No acute abnormality.

## 2015-01-20 IMAGING — CR DG KNEE COMPLETE 4+V*L*
1 series · 4 of 4 positions shown · non-contrast
Comparison: none

REASON FOR EXAM: injury
COMMENTS:

PROCEDURE:     DXR - DXR KNEE LT COMP WITH OBLIQUES  - July 17, 2013 [DATE]
RESULT:     Left knee images and demonstrate no definite fracture,
dislocation or radiopaque foreign bodies.

[Series 1: t knee ap left · 0.14mm/px · 4 of 4 slices shown]
[im 1/4]
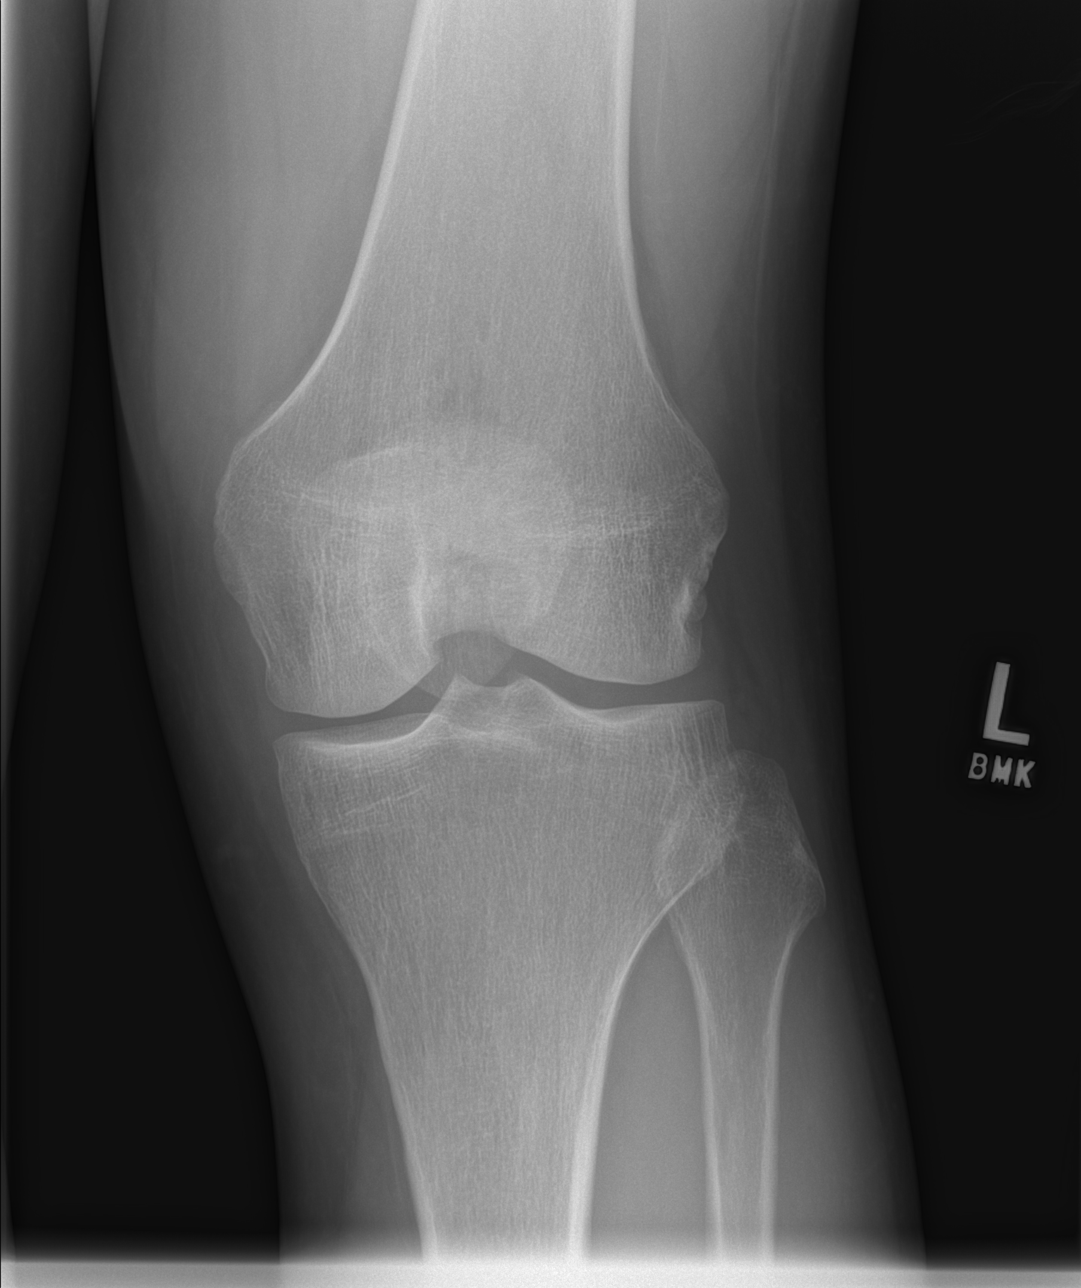
[im 2/4]
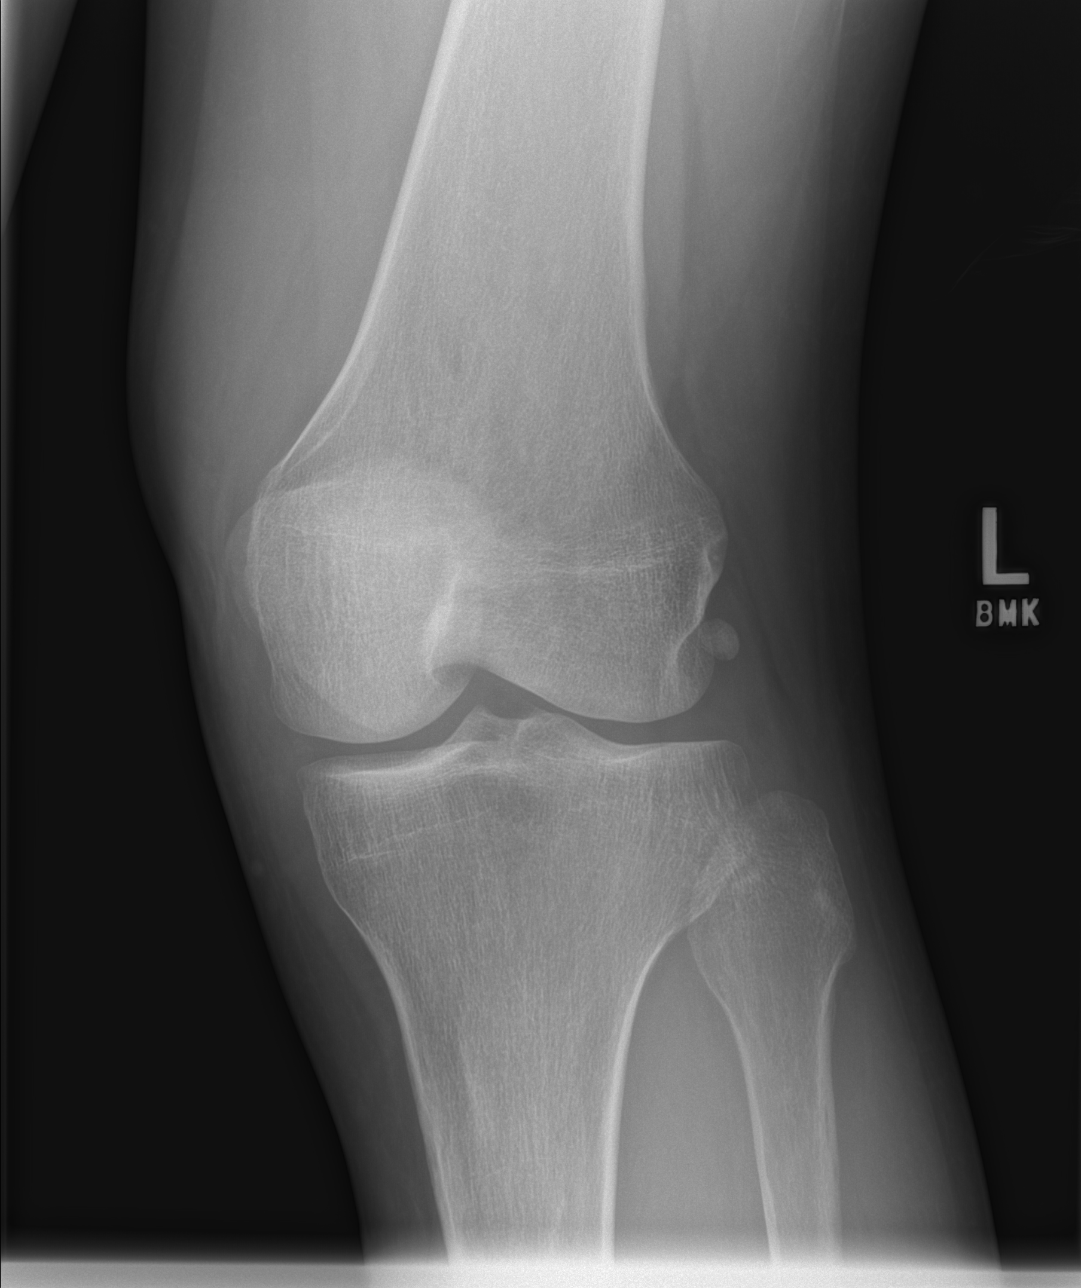
[im 3/4]
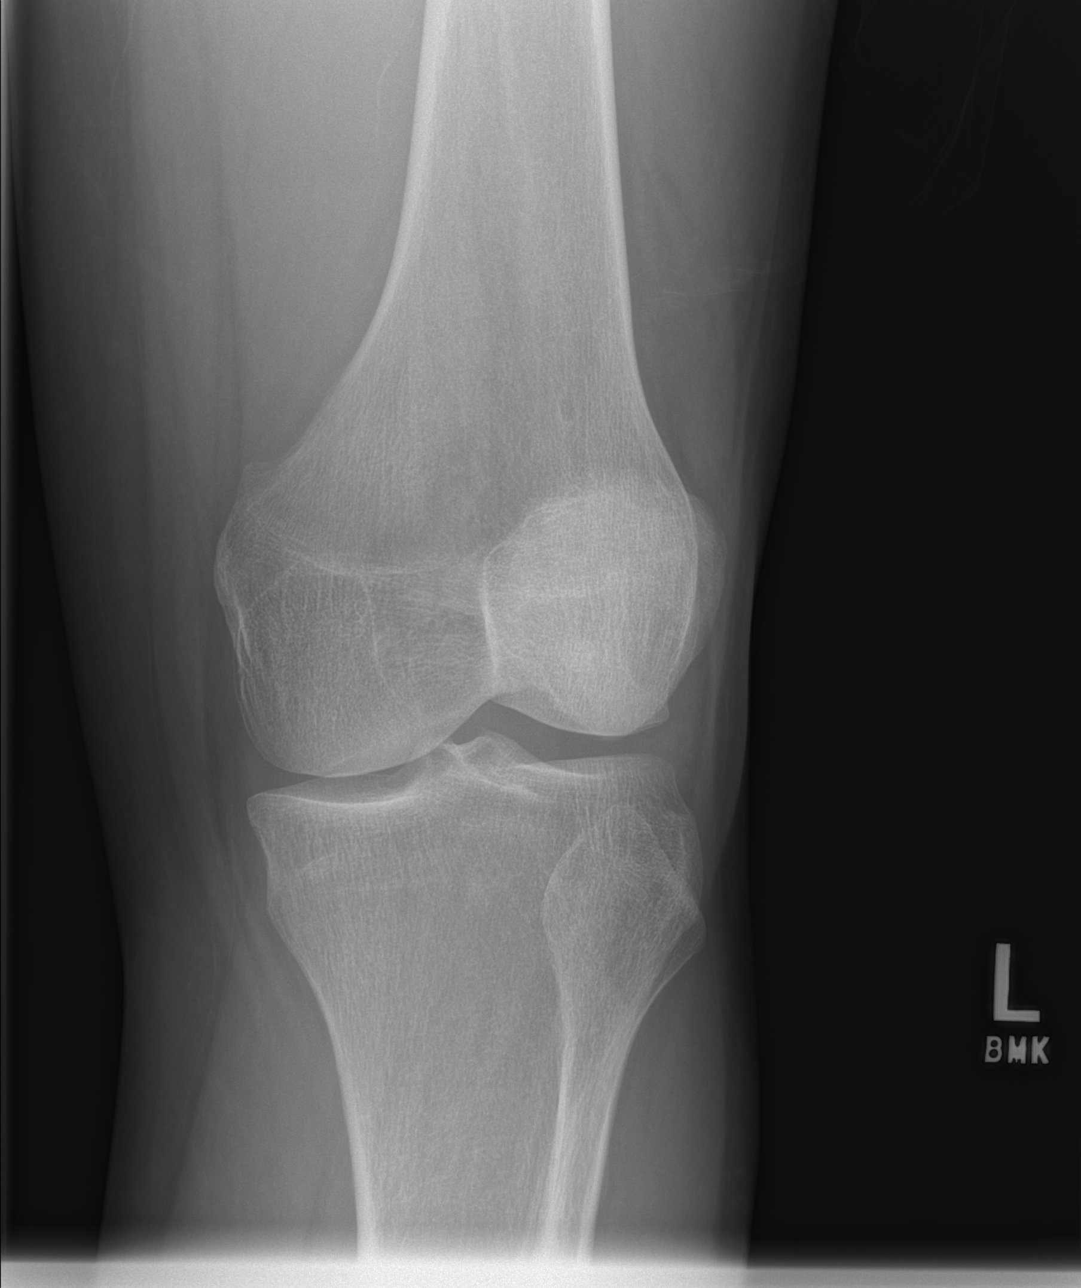
[im 4/4]
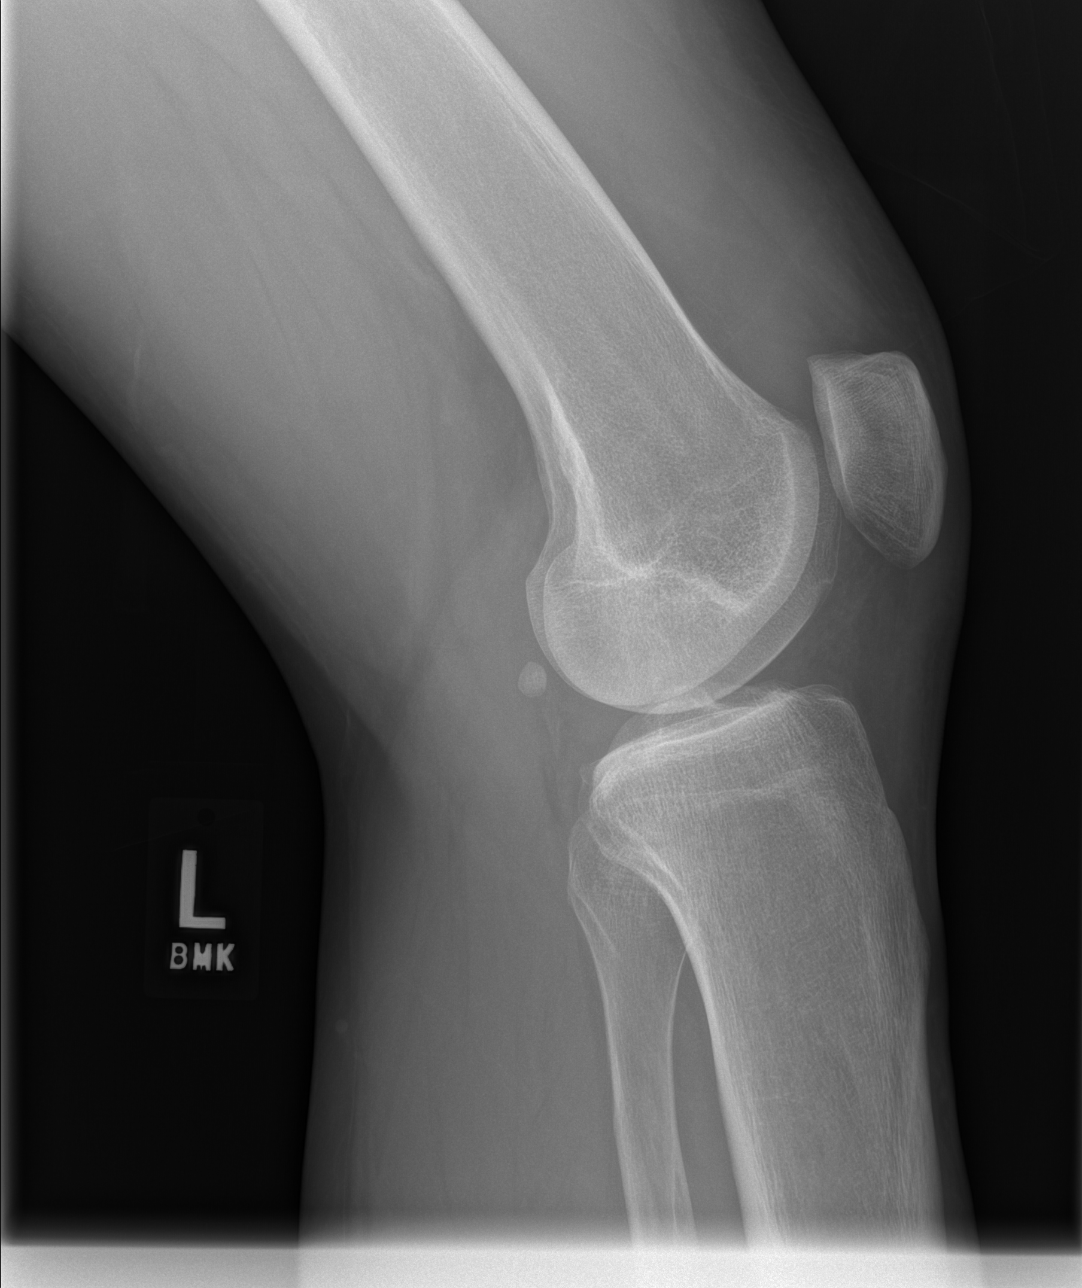

[4 of 4 positions shown; findings below may reference images not displayed]

IMPRESSION: No acute bony abnormality evident.

[REDACTED]

## 2015-02-20 ENCOUNTER — Emergency Department: Payer: Self-pay | Admitting: Emergency Medicine

## 2015-04-11 NOTE — Consult Note (Signed)
Brief Consult Note: Diagnosis: Etoh liver disease. Now alert and awake. Knows person place and time. Nursing says he goes in and out. This pattern is not consistant with HE.   Patient was seen by consultant.   Consult note dictated.   Comments: This patient has etoh liver disease with increased LFT's since 2012. Drinks 4 or more 40 oz bottles of beer a day. Now sitting and eating breakfast without any problems. U/S with possible cholicystitis. Consider surgery consult. Patient has been told that he needs to stop drinking.  Electronic Signatures: Midge MiniumWohl, Marten Iles (MD)  (Signed 14-Jun-14 11:26)  Authored: Brief Consult Note   Last Updated: 14-Jun-14 11:26 by Midge MiniumWohl, Tyjae Shvartsman (MD)

## 2015-04-11 NOTE — Discharge Summary (Signed)
PATIENT NAME:  Garrett Hayes, Garrett Hayes MR#:  [id number] DATE OF BIRTH:  11-03-1981  DATE OF ADMISSION:  10/17/2013 DATE OF DISCHARGE:  10/22/2013  HOSPITAL COURSE: See dictated history and physical for details of admission. This 34 year old man was admitted to the hospital for alcohol detox. He showed and agitated of delirium for about 24 hours during which he became very confused, but after that he improved gradually. By the time of discharge his thinking had returned to normal. Affect was euthymic. He was not shaky and seemed to be through his withdrawal. He was offered and encouraged to go to inpatient treatment at the alcohol and drug treatment center but refused this. He prefers instead follow up with [facility] in the community. The patient medically had a recent injury to his knee with a repair of his anterior cruciate ligament on one side. He had an episode of increased redness and tenderness, but ultrasound was negative. He was given an antibiotic after I discussed the case with internal medicine doctors to cover for possible cellulitis. At the time of discharge, he was calm, not suicidal, not dangerous able to take care of himself.   MENTAL STATUS EXAM AT DISCHARGE: Neatly dressed and groomed man, looks his stated age, cooperative with the interview. Eye contact good. Psychomotor activity normal given that he is in a wheelchair. Speech normal rate, tone and volume. Affect euthymic, reactive, appropriate. Mood stated as good. Thoughts appear to be lucid. No evidence of loosening of associations or delusions. Denies auditory or visual hallucinations. Denies suicidal or homicidal ideation. Shows improved judgment and insight, normal intelligence.   DISCHARGE MEDICATIONS: Keflex 500 mg q. 6 hours for another 10 days with medical follow-up.   LABORATORY RESULTS: Ultrasound of the leg showed no clot. Chemistry panel showed a low calcium 8.4, low sodium 132, otherwise unremarkable. Hematology panel: Low hematocrit at  38, otherwise unremarkable. Urinalysis: 1+ bilirubin.   DISPOSITION: Discharged home. Follow up with [facility].   DIAGNOSIS, PRINCIPAL AND PRIMARY:   AXIS I: Alcohol dependence.   SECONDARY DIAGNOSES: AXIS I: Substance-induced mood disorder.   AXIS II: Deferred.   AXIS III: Recent repair of the anterior cruciate ligament, rule out a cellulitis.   AXIS IV: Severe from joblessness and continued substance abuse.   AXIS V: Functioning at time of discharge 60.    ____________________________ Audery AmelJohn T. Zayana Salvador, MD jtc:cc D: 10/29/2013 22:46:10 ET T: 10/29/2013 22:56:14 ET JOB#: [id number]  cc: Audery AmelJohn T. Kewana Sanon, MD, <Dictator> Audery AmelJOHN T Manilla Strieter MD ELECTRONICALLY SIGNED 10/30/2013 11:05

## 2015-04-11 NOTE — H&P (Signed)
PATIENT NAME:  EDIN, KON MR#:  [id number] DATE OF BIRTH:  1981/01/17  DATE OF ADMISSION:  06/01/2013  PRIMARY CARE PHYSICIAN:  None.   REFERRING PHYSICIAN:  Dr. Charlesetta Ivory.   CHIEF COMPLAINT:  Hallucinations and altered mental status.   HISTORY OF PRESENT ILLNESS:  Mr. Whidby is a 34 year old white male with history of heavy alcohol use, came to the Emergency Department on 05/29/2013 stating that has been off of alcohol for four days and requested for assistance with possible alcohol withdrawal.  The patient stated that he drinks 4 40 ounces of beer on a daily basis.  Seen by psychiatry at the time and was gave 0.5 mg of Ativan, 10 pills.  Also, the patient was given an option of staying as an inpatient, however the patient refused and went home stating that he has good family support.  The following day, on 05/30/2013, the patient was brought in by the Police Department for hallucinations and erratic driving.  The patient was having hallucinations at that time.  Since then, the patient received multiple doses of Ativan, Haldol, Geodon.  The patient was seen by psychiatry on 05/31/2013 for evaluation.  At that time recommended to continue by mouth Ativan, Haldol and Geodon for three days.  When stable the patient could be discharged home.  However, the patient continued to have hallucinations and agitation.  The patient pulled off the IV line and continued to have the hallucinations.  Concerning this, medicine was consulted with the concern of alcohol withdrawal.  The patient's blood pressure remained stable from the time of admission to now.  Not tachycardic.  No elevated fever.  I could not obtain any history from the patient.  The patient is trying to reach for things sitting in the bed.   PAST MEDICAL HISTORY:  Alcohol abuse.   PAST SURGICAL HISTORY:  As per old records, left shoulder underneath injury.   ALLERGIES:  No known drug allergies.   HOME MEDICATIONS:  None.   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  Could not obtain a history from the patient, however as per records, the patient drinks 4 40 ounces of beer, however unknown history of smoking and drug use.   FAMILY HISTORY:  Could not be obtained from the patient.   REVIEW OF SYSTEMS:  Could not be obtained from the patient as the patient is currently hallucinating.   PHYSICAL EXAMINATION: GENERAL:  This is a well-built, well-nourished, age-appropriate male lying down in the bed, somewhat drowsy from multiple medications as well as trying to reach for things.  VITAL SIGNS:  Temperature 98.4, pulse 94, blood pressure 139/94, respiratory rate of 20, oxygen saturations 96% on room air.  HEENT:  Head normocephalic, atraumatic.  Eyes, no scleral icterus.  Conjunctivae normal.  Pupils equal and react to light.  Mucous membranes, mild dryness.  NECK:  Supple.  No lymphadenopathy.  No JVD.  No carotid bruit.  CHEST:  Has no focal tenderness.  LUNGS:  Bilaterally clear to auscultation.  HEART:  S1, S2 regular.  No murmurs are heard.  No pedal edema.  Pulses 2+.  ABDOMEN:  Bowel sounds plus.  Soft, nontender, nondistended.  Could not examine the hepatosplenomegaly as the patient was not cooperative.   NEUROLOGIC:  The patient has illogical speech.  The patient is as mentioned above not oriented to place, person and time.  No  apparent cranial nerve abnormalities.  Moving all four extremities.  Did not examine the sensory. SKIN:  Has tattoos all over the body.  Does not have any rash or lesions.  MUSCULOSKELETAL:  Moving all the extremities.  Did not appear to have any swelling or increased pain at the moment.   LABORATORY DATA:  CMP is completely within normal limits.  UA is negative for nitrites and leukocyte esterase.  Urine drug screen is negative.  CMP, total bilirubin of 1.4, alk phos of 212, AST 65.  CBC is  completely within normal limits except for a platelet count of 135.  TSH 1.05.    ASSESSMENT AND PLAN:  Mr. Tawil, 34 year old, is brought in for hallucinations.  1.  Hallucinations.  Urine drug screen is negative.  Does not show any signs of any infection.  Vital signs are well within normal limits including blood pressure and heart rate, mainly range in 60s to 70s, considering concern about the patient having underlying other psychiatric conditions which might be causing him to have the hallucinations.  The patient does not have any signs of sympathetic drive, even though patient has received multiple doses of Ativan, Haldol, Librium, Geodon.  Emergency Department physician requesting as this patient is not a candidate to go to psychiatry unit as the patient is requiring IV benzodiazepines.  Admit the patient to the CCU Stepdown Unit.  Keep the sitter at bedside.  Continue to follow up with CIWA protocol.  We will obtain psychiatric consult.  Continue with thiamine.  2.  Alcohol use.  Continue the thiamine IV.  3.  Mild elevation of the LFTs secondary to alcohol use.  4.  Keep the patient on deep vein thrombosis prophylaxis with Lovenox.   Time spent 47mn.   ____________________________ PMonica Becton MD pv:ea D: 06/01/2013 06:45:15 ET T: 06/01/2013 07:09:37 ET JOB#: [id number] cc: PMonica Becton MD, <Dictator> PMonica BectonMD ELECTRONICALLY SIGNED 06/01/2013 8:07

## 2015-04-11 NOTE — Consult Note (Signed)
PATIENT NAME:  Garrett Hayes, Conn MR#:  [id number] DATE OF BIRTH:  May 16, 1981  DATE OF CONSULTATION:  06/02/2013  REFERRING PHYSICIAN:   CONSULTING PHYSICIAN:  Midge Miniumarren Aedon Deason, MD  CONSULTING SERVICE:  Gastroenterology.   REASON FOR CONSULTATION:  Abnormal liver enzymes and possible hepatic encephalopathy with increased ammonia.   HISTORY OF PRESENT ILLNESS:  This patient is a 34 year old gentleman who has a long history of alcohol abuse with a report of him drinking up to four 40 ounces of beer on a daily basis.  The patient has some psychiatric history and came in with altered mental status and hallucinations.  The patient had been put on Ativan and had taken 0.5 mg of Ativan and was reported to have taken 10 pills of this.  The patient also reports that he has quit drinking in the past and just went back to drinking again.  The patient's ammonia level was slightly elevated at 45 and I am now being asked to see the patient for possible hepatic encephalopathy.  The patient's liver enzymes have been elevated all the way back to 08/27/2011.   PAST MEDICAL HISTORY:  Alcohol abuse.   ALLERGIES:  No known drug allergies.   HOME MEDICATIONS:  None.   SOCIAL HISTORY:  The patient has a long extensive history of alcohol abuse and tobacco use.   FAMILY HISTORY:  Noncontributory.   REVIEW OF SYSTEMS:   A 10 point review of systems negative except what is stated above.   PHYSICAL EXAMINATION:  VITAL SIGNS:  Temperature 98.7, pulse 86, respirations 22, blood pressure 115/76, pulse oximetry 93%.  HEENT:  Normocephalic, atraumatic.  Extraocular motor intact.  Pupils equally round and reactive to light and accommodation, without JVD, without lymphadenopathy.  LUNGS:  Clear to auscultation bilaterally.  HEART:  Regular rate and rhythm without murmurs, rubs or gallops.  ABDOMEN:  Soft, nontender, nondistended without hepatosplenomegaly.  EXTREMITIES:  Without cyanosis, clubbing or edema.  NEUROLOGICAL:  The  patient answering questions appropriately, orientated to time, place, and person.  SKIN:  With multiple tattoos all over his body.  MUSCULOSKELETAL:  Good range of motion.   ANCILLARY SERVICES:  Showed the patient to have a total bilirubin of 1.2 with an AST of 45 and a normal ALT of 43 with an increased alkaline phosphatase of 160.  The patient's white cell count was 7.2.  The patient also had a CT scan of his head without any acute abnormalities and an ultrasound of his right upper quadrant that showed him to have a large amount of sludge noted within the gallbladder, non-shadowing stones could not be excluded with thickening gallbladder wall present.  These findings were suggestive of the presence of cholecystitis.   ASSESSMENT AND PLAN:  This patient is a 34 year old gentleman with extensive ETOH abuse history with alcoholic hepatitis and an ultrasound consistent with possible acute cholecystitis.  The patient should be seen by surgery for possible acute cholecystitis.  The patient is alert and orientated and from his nurse it appears that he goes in and out of this throughout the day which is unlikely to be from hepatic encephalopathy, which is more of a consistent presentation than waxing and waning.  The patient also does not have flapping tremor or other signs of hepatic encephalopathy or decompensated liver disease.  Would look for other causes of the patient's mental status changes and hallucinations.   Thank you very much for involving me in the care of this patient.  If you have any questions, please  do not hesitate to call.     ____________________________ Midge Minium, MD dw:ea D: 06/02/2013 15:45:55 ET T: 06/03/2013 02:44:36 ET JOB#: [id number]  cc: Midge Minium, MD, <Dictator> Midge Minium MD ELECTRONICALLY SIGNED 06/03/2013 7:29

## 2015-04-11 NOTE — Consult Note (Signed)
Brief Consult Note: Diagnosis: ALCOHOL W/D DELIRIUM, ALCOHOL DEPENDENCE.   Patient was seen by consultant.   Recommend further assessment or treatment.   Orders entered.   Comments: Pt seen in ED. he was recently d/c and was brought again by the police as he was driving erratically and was unable to answer the questions appropriately. During my interview, pt did not answer the questions well as he was recently given the Lorazepam. however, he appeared to be hallluicnating as well. No Si/hi noted.   Plan; Case discussed with ED physician.  Will add Librium 25mg  po q4hrs x 3 days.  Haldol 2 mg po tid x 3 days.  Cogentin 0.5 mg po tid x 3 days.  Once stable, he can be discharged.  Electronic Signatures: Rhunette CroftFaheem, Kimm Sider S (MD)  (Signed 12-Jun-14 14:02)  Authored: Brief Consult Note   Last Updated: 12-Jun-14 14:02 by Rhunette CroftFaheem, Markese Bloxham S (MD)

## 2015-04-11 NOTE — Discharge Summary (Signed)
PATIENT NAME:  Garrett Hayes, BRANDOW MR#:  [id number] DATE OF BIRTH:  [birth year]  DATE OF ADMISSION:  06/01/2013 DATE OF DISCHARGE:  06/03/2013  DISCHARGE DIAGNOSES:  1. Hallucinations due alcohol withdrawal.  2. Alcohol abuse.  3. Elevated liver function tests, came down, may be alcohol related.  4. Sludge in the gallbladder, need to follow with surgery in the clinic.  5. Elevated ammonia.  6. Possible hepatic encephalopathy.   CONDITION ON DISCHARGE: Stable.   CODE STATUS: FULL CODE.   MEDICATIONS ON DISCHARGE: Multivitamin once a day, folic acid 1 mg once a day.   HOME OXYGEN ON DISCHARGE: No.   DIET ON DISCHARGE: Regular diet. Consistency regular.   ACTIVITY: As tolerated.   TIMEFRAME TO FOLLOWUP: Within 2 to 4 weeks. Advised to follow with PMD. Have visit to surgery outpatient clinic for gallbladder sludge.   HISTORY OF PRESENTING ILLNESS: Please see H and P done by Dr. Heron Nay on 13th of June. This 34 year old male with heavy alcohol use presented to the Emergency Room on the 10th of June saying that he had been off alcohol for 4 days and wanted assistance with alcohol withdrawal. Seen by psychiatry, and he was given 0.5 mg of Ativan, 10 tablets, and sent home. He was also given an option to stay as inpatient, but the patient refused that and he went home. The next day, the police brought the patient with hallucinations and erratic driving, and since then, the patient was in the ER and was given multiple medications, Ativan, Haldol, Geodon, for these hallucinations, but they did not subside. Multiple visits by psychiatrist were done in the hospital and did not go away, and the patient was admitted to medicine finally for his alcohol withdrawal, delirium tremens and hallucinations.   HOSPITAL COURSE AND STAY:  1. Initially, the patient was admitted to CCU with Precedex drip, and he had gradual and slow improvement. He never had significant signs and symptoms of sympathetic overactivity  like tachycardia and high blood pressure of alcohol withdrawal, but Precedex drip helped him and then he came off. He was maintained on Ativan on as needed basis. His ammonia level was also slightly elevated, so we started on lactulose on suspicion of hepatic encephalopathy. Also, CT of the head was negative. Psych consult was done, and they agreed with the management.  2. Hepatic encephalopathy: As mentioned above, possibly due to high ammonia level and chronic alcohol use. We gave lactulose, and the patient became totally alert and oriented the next day.  3. Elevated LFTs: Right upper quadrant sonogram was done. Hepatic panel was done due to multiple tattoos. He had sludge in the gallbladder but no symptoms of pain, fever, so we advised to follow in the surgical clinic as outpatient.  4. Alcohol abuse: He was on CIWA protocol. Vitamin replacement was done. Psych consult was called in, and they cleared the patient for discharge after his withdrawal was over.   CONSULTS IN THE HOSPITAL: GI consult with Dr. Servando Snare for elevated LFTs and gallbladder sludge and psych consult with Dr. Margarita Rana for his hallucinations.   LABORATORY RESULTS IN THE HOSPITAL: Creatinine was 0.81. Alcohol level was less than 3 on admission. Alkaline phosphatase was 212 on admission which came down to 160 later on. Ammonia level was 45. Urine was negative for any toxicology on admission. Hemoglobin was 15.4. INR was 1.1. Urinalysis was negative on admission. Hepatitis A, B and C panel was grossly negative.   TOTAL TIME SPENT ON THIS DISCHARGE: 45 minutes.  ____________________________ Hope PigeonVaibhavkumar G. Elisabeth PigeonVachhani, MD vgv:gb D: 06/06/2013 23:07:00 ET T: 06/07/2013 03:40:52 ET JOB#: [id number]  cc: Hope PigeonVaibhavkumar G. Elisabeth PigeonVachhani, MD, <Dictator> Altamese DillingVAIBHAVKUMAR Sohrab Keelan MD ELECTRONICALLY SIGNED 06/26/2013 14:17

## 2015-04-11 NOTE — Consult Note (Signed)
Chief Complaint:  Subjective/Chief Complaint Patient continues to be alert without any complaints. He says he is no longer having any confusion or hullucinations.   VITAL SIGNS/ANCILLARY NOTES: **Vital Signs.:   15-Jun-14 05:59  Vital Signs Type Routine  Temperature Temperature (F) 98  Celsius 36.6  Temperature Source oral  Pulse Pulse 86  Respirations Respirations 18  Systolic BP Systolic BP 128  Diastolic BP (mmHg) Diastolic BP (mmHg) 87  Mean BP 100  Pulse Ox % Pulse Ox % 98  Pulse Ox Activity Level  At rest  Oxygen Delivery Room Air/ 21 %   Brief Assessment:  GEN well developed, no acute distress   Respiratory normal resp effort   Additional Physical Exam A & O x3   Assessment/Plan:  Assessment/Plan:  Assessment Confusion and etoh abuse.   Plan Patient stable. No confusion. Will sign off. Please call with any questions.   Electronic Signatures: Midge MiniumWohl, Omelia Marquart (MD)  (Signed 15-Jun-14 07:27)  Authored: Chief Complaint, VITAL SIGNS/ANCILLARY NOTES, Brief Assessment, Assessment/Plan   Last Updated: 15-Jun-14 07:27 by Midge MiniumWohl, Angelia Hazell (MD)

## 2015-04-11 NOTE — H&P (Signed)
PATIENT NAME:  Garrett Hayes, Garrett Hayes MR#:  [id number] DATE OF BIRTH:  December 12, 1981  DATE OF ADMISSION:  10/17/2013  IDENTIFYING INFORMATION AND CHIEF COMPLAINT:  A 34 year old man with a history of alcohol dependence came to the hospital.   CHIEF COMPLAINT:  "I think I should get detoxed."   HISTORY OF PRESENT ILLNESS:  Information obtained from the patient and the chart.  He says that he came to the hospital to get detoxed off of alcohol and opiates.  He has been drinking up to a fifth of liquor a day with his last drink however being two days ago.  He also has been taking narcotic pain medicines that were prescribed after getting knee surgery recently.  He says that he has been using the pain medicine as prescribed, but is still worried about it.  He thinks he is not able to stop his drinking on his own, feels somewhat out of control, wants detox.  Denies that he is feeling suicidal or depressed.  No psychotic symptoms.   PAST PSYCHIATRIC HISTORY:  He has had a history of alcohol withdrawal and detox in the past.  He has also been to our Emergency Room multiple times requesting detox, but then not been admitted.  He says he usually does not follow up with outpatient treatment when he does that and feels like he has just been "letting himself down."  He denies any suicide attempts.  Denies any violence or aggression.  Denies any other mental health treatment.   SOCIAL HISTORY:  Currently not working, lost his job a few weeks ago, has not been able to go back to work because of his knee injury.  Lives by himself.  Does have a pretty good relationship with his family from what he reports.   PAST MEDICAL HISTORY:  Recent surgery to his left knee because of a torn ligament.  He otherwise has minimal significant medical problems.   SUBSTANCE ABUSE HISTORY:  Long history of abuse of alcohol and other drugs.  Currently not using other drugs other than the recent opiates which he says he is not misusing, but still  wants to get detox from.  He has a history of a complicated withdrawal from his report, but no seizures.   FAMILY HISTORY:  None reported.   CURRENT MEDICATIONS:  Oxycodone 5 to 10 mg 3 times a day as needed pain.   ALLERGIES:  PENICILLIN.   REVIEW OF SYSTEMS:  Feeling a little bit shaky, a little bit sick to his stomach, some pain in his knee, all under adequate control.  Denies suicidal or homicidal ideation.  Denies feeling depressed or panicky.  No psychotic symptoms.   MENTAL STATUS EXAMINATION:  Adequately groomed man looks his stated age, cooperative with the interview.  Good eye contact, normal psychomotor activity.  Speech normal in rate, tone and volume.  Affect euthymic, reactive, appropriate.  Mood stated as okay.  Thoughts are lucid.  No evidence of loosening of associations or delusions.  Denies auditory or visual hallucinations.  Denies suicidal or homicidal ideation.  Shows adequately good judgment and insight.  Normal intelligence.  Alert and oriented x 4.   PHYSICAL EXAMINATION: GENERAL:  Left knee still a little bit swollen  status post surgery.  He is not able to stand on it without using crutches.  Otherwise strength and reflexes normal throughout.  Cranial nerves symmetric and normal.  Face symmetric.  Oral mucosa normal.  Pupils equal and reactive. NECK AND BACK:  Nontender.  Full  range of motion at extremities except in his left knee.  LUNGS:  Clear to auscultation.  HEART:  Regular rate and rhythm.  ABDOMEN:  Soft, nontender, normal bowel sounds.  VITAL SIGNS:  Temperature last checked at 101.1, pulse 124, respirations 18, blood pressure 140/75.   LABORATORY RESULTS:  Glucose slightly elevated at 106, sodium low 132, calcium low 8.4.  Magnesium normal.  Opiates positive in the drug screen.  Urinalysis, 1+ bilirubin.  TSH a little low at 0.29.  Chemistry panel:  Low sodium 127, low chloride 96.  CBC, low hematocrit at 38.0.   ASSESSMENT:  A 34 year old man with alcohol  dependence and opiate possible dependence, wants detox.  Cooperative with treatment.  No suicidal ideation, but feels very sincere in wanting to stop drinking.  Reasonable to admit him given a history of failed outpatient treatment otherwise.   TREATMENT PLAN:  Admit to psychiatry.  Alcohol withdrawal protocol.  Recheck labs tomorrow.  Engage him in groups and activities on the unit.  Discussed possible options for follow-up treatment.   DIAGNOSIS, PRINCIPLE AND PRIMARY:  AXIS:  Alcohol dependence.   SECONDARY DIAGNOSES: AXIS I:   1.  Opiate abuse. 2.  Substance-induced mood disorder.  AXIS II:  No diagnosis.  AXIS III:  Recent anterior cruciate ligament surgery.  AXIS IV:  Moderate from being out of a job, strained social relations.  AXIS V:  Functioning at time of evaluation 40.     ____________________________ Audery AmelJohn T. Clapacs, MD jtc:ea D: 10/17/2013 22:19:47 ET T: 10/18/2013 00:06:13 ET JOB#: [id number]  cc: Audery AmelJohn T. Clapacs, MD, <Dictator> Audery AmelJOHN T CLAPACS MD ELECTRONICALLY SIGNED 10/18/2013 16:01

## 2015-04-11 NOTE — Consult Note (Signed)
PATIENT NAME:  Garrett Hayes, [name] MR#:  [id number] DATE OF BIRTH:  [birth year]  AGE:  34 years  SEX:  Male  RACE:  White  DATE OF CONSULTATION:  06/03/2013  PLACE OF DICTATION:  [facility], room #212, [place], [place]    CONSULTING PHYSICIAN:  Marlayna Bannister K. Jolonda Gomm, MD  SUBJECTIVE:  The patient was seen in consult in room #212. According to information obtained from the chart, patient had an ammonia level of 45 and ETOH level of 68, and brought to ED by police after they pulled him over for driving erratically while taking Ativan p.o., with a long history of alcoholism. Dr. Lucia BitterFahem saw him on 05/31/2013 and ordered him Librium 25 mg p.o. q. 4 hours for 3 days, and Haldol 2 mg p.o. t.i.d. for 3 days, and Cogentin 0.5 mg p.o. t.i.d. for 3 days. She recommended that once he is stable, he can be discharged to go home. 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 He is married for 4 years, but is currently separated and lives with his parents. The patient reports that he is being followed by AA, and has a sponsor and is in touch with the sponsors. He is eager to go home, and consultation was requested for him to be cleared for discharge. However, according to the information on the consult sheet, he is being considered for surgery for possible cholecystitis and medications for preop and postop were being asked for recommendation.   OBJECTIVE:  The patient is seen lying comfortably in bed. Alert and oriented. Calm, pleasant and cooperative. Denies feeling depressed. Denies feeling hopeless or helpless. No psychosis. Does not appear to be withdrawing from alcohol. Appears very comfortable. Does not appear to be responding to any internal stimuli. Denies suicidal or homicidal intent. Insight and judgment fair. He is eager to go back home and go back to his job and be followed by AA and be followed at local behavioral health  clinic.  IMPRESSION:  Alcohol dependence, with intoxication at time of admission, resolved.  RECOMMENDATION:  The patient may be discharged to go home and attend AA meeting and be followed at [facility], and is aware how to contact the same.   ____________________________ Jannet MantisSurya K. Guss Bundehalla, MD skc:mr D: 06/03/2013 14:15:07 ET T: 06/03/2013 22:41:03 ET JOB#: [id number]  cc: Monika SalkSurya K. Guss Bundehalla, MD, <Dictator> Beau FannySURYA K Jontavious Commons MD ELECTRONICALLY SIGNED 06/09/2013 18:12

## 2015-04-11 NOTE — Consult Note (Signed)
PATIENT NAME:  Garrett Hayes, Garrett Hayes MR#:  [id number] DATE OF BIRTH:  1981-09-13  DATE OF CONSULTATION:  05/29/2013  REFERRING PHYSICIAN: Sheryl L. Mindi Junker, MD CONSULTING PHYSICIAN:  Ardeen Fillers. Garnetta Buddy, MD  REASON FOR CONSULTATION: I have gone through withdrawal before, and I  came here to get some medications.   HISTORY OF PRESENT ILLNESS: The patient is a 34 year old white male who presented to the ER requesting medications to help through the detox. He reported that he suddenly stopped drinking 4-40 ounces of alcohol, which he has been consuming for the past year. He reported that he has been drinking steadily, but then he stopped 4 days ago. He reported that he moved into his parents' home and broke up with his girlfriend. His 59 year old sister also lives with them. The patient reported that he started experiencing tremors with nausea and vomiting and was sweating. The patient reported that he also started hearing voices yesterday, and the voices were asking him to kill himself. He became concerned, and he spoke with his parents. His mother asked him to go to her primary care physician, but he decided to come to the Emergency Room. The patient reported that he came to the Emergency Room. Since this morning, he has not had experienced any auditory hallucinations. He reported that he has experienced similar symptoms in the past when he suddenly stopped drinking as well. The patient reported that he was concerned last night and was unable to sleep. The patient reported that he was feeling weird, so he decided to come here. He stated that "I need some medications to get through this." He stated that he is not having any suicidal or homicidal ideations or plans at this time. He reported that he is not experiencing any manic symptoms, anxiety or shakes at this time. The patient appeared very calm and collected during the interview. He reported that he has to go back for his interview tomorrow. He reported that he has  been diagnosed with alcohol-related symptoms, including problems with his liver as well as hepatitis and jaundice in the past. However, he had continued to drink steadily for those past years. He stated that he is not taking any medications on a regular basis at this time.   PAST PSYCHIATRIC HISTORY: The patient was admitted in 2012 to the inpatient behavioral health unit with similar symptoms when he started drinking and was hallucinating, hearing things, seeing people and having people talk on the TV to him. He was also becoming angry and agitated and was given medications. He reported that he was also prescribed sertraline and Celexa recently, but he does not like taking those medications and has stopped them. He stated that he has participated in at least 6 or 7 rehab programs in the past. He does not like taking medication for depression or anxiety at this time. He is not seeing any psychiatrist on a regular basis.   FAMILY PSYCHIATRIC HISTORY: The patient denied any history of suicide attempts or mental illness in his family.   PAST MEDICAL HISTORY: Status post left shoulder and knee injury.   CURRENT MEDICATIONS: None.   SOCIAL HISTORY: The patient has graduated from high school and has completed Metallurgist from [employer]. 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 He reported that he has an interview tomorrow. The patient was married for 4 years in the past, but is currently divorced. He has recently broken up with his girlfriend  as well.   REVIEW OF SYSTEMS:  CONSTITUTIONAL: The patient currently denies any fever or chills. No weight changes.  EYES: No double or blurred vision.  ENT: No hearing loss.  RESPIRATORY: No shortness of breath or cough.  CARDIOVASCULAR: Denies any chest pain or orthopnea.  GASTROINTESTINAL: No abdominal pain, nausea, vomiting, diarrhea. GENITOURINARY: No incontinence or frequency.  ENDOCRINE:  No heat or cold intolerance.  LYMPHATIC: No anemia or easy bruising.  INTEGUMENTARY: No acne or rash.  MUSCULOSKELETAL: Denies any muscle or joint pain.  NEUROLOGIC: No tingling or weakness.   PHYSICAL EXAMINATION:  VITAL SIGNS: Temperature 98, pulse 83, respirations 20, blood pressure 158/97.   LABORATORY DATA: Glucose 101, BUN 6, creatinine 0.62, sodium 137, potassium 3.5, chloride 103, bicarbonate 26, anion gap 8, osmolality 272, calcium 9.2. Blood alcohol level 68. Protein 8.4, albumin 4.0, bilirubin 1.1, alkaline phosphatase 246, AST 88, ALT 67. Thyroid stimulating hormone 1.05. UDS was negative. WBC 8.9, RBC 4.78, hemoglobin was 15.8, hematocrit 45.6, MCV 96, RDW 13.7.   MENTAL STATUS EXAMINATION: The patient is a moderately built male who has tattoos all over his body. He appeared calm and collected. He was pleasant and polite and was wearing hospital scrubs. He maintained good eye contact. His speech was normal in tone and volume. Mood was fine. Affect was congruent. Thought process was logical, goal-directed. Thought content was non-delusional. He does not appear to have any withdrawal symptoms and does not have any tremors or shakes at this time. He currently denied having any withdrawal symptoms, including hallucinations or delusions. No auditory or visual hallucinations noted. He demonstrated fair insight and judgment about his withdrawal symptoms.   DIAGNOSTIC IMPRESSION:  AXIS I:  1. Alcohol dependence.  2. Alcohol withdrawal, no acute symptoms noted.  AXIS II: None.  AXIS III: None.   TREATMENT PLAN: I discussed with the patient at length about getting detoxification, but he denied and reported that he can handle this at home. He is not interested in going to a rehab program at this time. He reported that he needs some medication, and his mother is also very supportive, and she can take him to her primary care physician. He reported that he is not having any active withdrawal  symptoms at this time. His records were also reviewed. The patient will be given 10 pills of Ativan 0.5 mg p.o. q.h.s. p.r.n. to take on a regular basis at home. Advised him to come back to the Emergency Room if he notices worsening of his symptoms, and he demonstrated understanding. The patient will be discharged in a safe manner from the Emergency Room at this time. No acute symptoms noted. He will follow up with his primary care physician on an outpatient basis.   Thank you for allowing me to participate in the care of this patient.   ____________________________ Ardeen FillersUzma S. Garnetta BuddyFaheem, MD usf:OSi D: 05/29/2013 11:42:39 ET T: 05/29/2013 12:29:24 ET JOB#: [id number]  cc: Ardeen FillersUzma S. Garnetta BuddyFaheem, MD, <Dictator> Rhunette CroftUZMA S Nayeli Calvert MD ELECTRONICALLY SIGNED 05/31/2013 12:49

## 2015-04-13 NOTE — Consult Note (Signed)
PATIENT NAME:  Garrett Hayes, Garrett Hayes MR#:  [id number] DATE OF BIRTH:  [birth year]  DATE OF CONSULTATION:  02/22/2012  REFERRING PHYSICIAN:  Dorothea GlassmanPaul Malinda, MD CONSULTING PHYSICIAN:  Rolly PancakeVivek J. Cherlynn KaiserSainani, MD  PRIMARY CARE PHYSICIAN: None.   CHIEF COMPLAINT: Jaundice and episodic vomiting.   HISTORY OF PRESENT ILLNESS: This is a 34 year old male who comes in from home due to yellowish discoloration of his eyes and also having some episodic vomiting. As per the patient, he has been having some intermittent nausea and vomiting over the past month. It is not usually with every meal, but happens every other day. It is nonbloody in nature. He denies any dysphagia. He apparently was able to eat a full meal last night with no problems. She denies any diarrhea or any bloody stools. He denies any abdominal pain. The patient does have a history of heavy alcohol abuse, but has been trying to taper his alcohol abuse for the past couple of months with only two to three beers daily. He used to drink pretty heavily, about 3/4 case of beer. When the patient presented to the hospital here, he was noted to be severely jaundice with a total bilirubin over 10. He was noted to have significantly abnormal liver function tests and hospice services were then contacted for further treatment and evaluation.   REVIEW OF SYSTEMS: CONSTITUTIONAL: No documented fever. No weight gain. No weight loss. HEENT: No blurry or double vision. ENT: No tinnitus or postnasal drip. No redness of the oropharynx. RESPIRATORY: No cough, no wheeze, no hemoptysis, and no dyspnea. CARDIOVASCULAR: No chest pain, no orthopnea, no palpitations, and no syncope. GASTROINTESTINAL: Positive nausea. No vomiting, no diarrhea, no abdominal pain, no melena, and no hematochezia. GU: No dysuria or hematuria. ENDOCRINE: No polyuria or nocturia. No heat or cold intolerance. HEME: No anemia, no bruising, and no bleeding. INTEGUMENTARY: No rashes and no lesions. MUSCULOSKELETAL: No  arthritis, swelling, or gout. NEUROLOGIC: No numbness, no tingling, no ataxia, and no seizure-type activity. PSYCH: No anxiety, no insomnia, and no ADD.   PAST MEDICAL HISTORY:  1. Depression.  2. Alcohol abuse.  3. Ongoing tobacco abuse.   SOCIAL HISTORY: He does drink about 2 to 3 beers daily. He has a history of heavy alcohol abuse. He does smoke about a pack of cigarettes per day. No illicit drug abuse. He lives at home with his girlfriend. He is currently unemployed.   FAMILY HISTORY: Both mother and father are alive. Father has diabetes. Mother is healthy with no medical problems.   CURRENT MEDICATIONS:  1. Tegretol 200 mg two tabs twice a day. 2. Celexa 40 mg daily.  3. Zofran 4 mg every eight hours p.r.n.  4. Promethazine 25 mg every six hours p.r.n.   PHYSICAL EXAMINATION:   VITAL SIGNS: During consultation the patient's temperature was 98, pulse 71, respirations 18, blood pressure 119/72, and saturations are 96% on room air.   GENERAL: He is a pleasant appearing male in no apparent distress.   HEENT: Head is atraumatic, normocephalic. Extraocular muscles are intact. Sclera is icteric.   NECK: Supple. No jugular venous distention, no bruits, no lymphadenopathy, and no thyromegaly.   HEART: Regular rate and rhythm. No murmurs, rubs, or clicks.   LUNGS: Clear to auscultation bilaterally. No rales, no rhonchi, and no wheezes.   ABDOMEN: Soft, flat, nontender, and nondistended. He does have positive splenomegaly and also positive hepatomegaly.   EXTREMITIES: No evidence of any cyanosis, clubbing, or peripheral edema. He has +2 pedal and radial  pulses bilaterally.   NEUROLOGICAL: The patient is alert, awake, and oriented x3 with no focal motor or sensory deficits appreciated bilaterally.   SKIN: Moist and warm with no rash appreciated.   LYMPHATIC: There is no cervical or axillary lymphadenopathy.   LABS/STUDIES: Serum glucose 113, BUN 12, creatinine 0.9, sodium 134,  potassium 4.1, chloride 98, bicarbonate 26. Total protein 6.2, albumin 2.4, total bilirubin 10.5, alkaline phosphatase 778, AST 216, and ALT 116. Tegretol level 12.9. White cell count 5.1, hemoglobin 14.1, hematocrit 41.4, and platelet count 138. Prothrombin time 12.5. INR 0.9.   The patient did have an abdominal ultrasound done which showed thickened gallbladder wall with some gallbladder sludge. No definite pericholecystic fluid. No sonographic Murphy's sign. He did have fatty infiltration of the liver.   ASSESSMENT AND RECOMMENDATIONS: This is a 34 year old male with past medical history of ongoing tobacco abuse and history of heavy alcohol abuse who presents to the hospital with jaundice and abnormal liver function tests.   Jaundice and abnormal liver function tests: The exact etiology of this is currently unclear. The patient's abdominal ultrasound does not suggest any evidence of any obstruction like a common bile duct stone or even evidence of acute cholecystitis. The patient does have a strong history of alcohol abuse; therefore, there is some suspicion this could be related to alcohol abuse. The patient also has multiple tattoos throughout his body. Therefore there is some concern for hepatitis. We have sent off a hepatitis panel. There could be many other things that could be causing his liver dysfunction including autoimmune hepatitis and hemochromatosis the work-up which still needs to be done. I strongly recommended that the patient stay and see a gastroenterologist and get further work-up for his liver dysfunction, although the patient presently does not want to stay and wants to leave against medical advice. I strongly advised that the patient stay and have this work-up as this could result in full hepatic failure and he could lose complete function of his liver. The patient understands and still wants to leave against medical advice at this point. I did advise him that he needs to stop taking  his Tegretol as his Tegretol level is up and it is metabolized by the liver. His Celexa dose should be reduced from 40 mg to 20 mg daily, which I will let him know. The patient was strongly advised to at least follow up          with a physician as an outpatient or if he develops any further symptoms to come back to the emergency room.   TIME SPENT ON CONSULTATION: 50 minutes. ____________________________ Rolly Pancake. Cherlynn Kaiser, MD vjs:slb D: 02/22/2012 16:01:15 ET T: 02/22/2012 16:40:43 ET JOB#: [id number]  cc: Rolly Pancake. Cherlynn Kaiser, MD, <Dictator> Houston Siren MD ELECTRONICALLY SIGNED 02/24/2012 8:15

## 2015-04-13 NOTE — Consult Note (Signed)
Brief Consult Note: Diagnosis: 1. Painless jaundice 2. Acute hepatitis 3. hx of ETOH abuse 4. Ongoing tobacco abuse.   Patient was seen by consultant.   Consult note dictated.   Discussed with Attending MD.   Comments27: 34 yo male w/ hx of ETOH abuse, ongoing tobacco abuse, depression came into hospital due to jaundice and episodic vomiting.    1. Jaundice w/ abnormal LFT's - etiology currently unclear.  ?? Viral Hepatitis (vs) ETOH liver disease (vs) autoimmune hepatitis (vs) Hemochromatosis and many others.  - pt. needs a GI evaluation and further testing.  - pt. DOES NOT WANT TO STAY IN HOSPITAL AND GET FURTHER WORK UP.   - I explained to him that he is at high risk for having fulminant hepatic failure.  Pt. understands and still wants to leave AMA.   - I have told him to discontinue his Tegretol as his level is up and reduce his Celexa dose to 20 mg Daily for now.   - pt. was advised that if he develops any worsening symptoms of N/V/Abdominal pain to return to the ER.   - this was also discussed w/ the ER physician as pt. is leaving AMA.    2. ETOH abuse - advised pt. to quit drinking due to his liver dysfunction.  3. Depression - d/c Tegretol and redue Celexa to 20 mg Daily.    Time Spent:  50 min.   Job # [id number].  Electronic Signatures: Houston SirenSainani, Asmaa Tirpak J (MD)  (Signed [id number] 446205-Mar-13 16:07)  Authored: Brief Consult Note   Last Updated: 05-Mar-13 16:07 by Houston SirenSainani, Analicia Skibinski J (MD)

## 2015-04-13 NOTE — Consult Note (Signed)
PATIENT NAME:  Garrett Hayes, Garrett Hayes MR#:  [id number] DATE OF BIRTH:  1981-04-02  DATE OF CONSULTATION:  02/22/2012  CONSULTING PHYSICIAN:  Adah Salvageichard E. Excell Seltzerooper, MD  CHIEF COMPLAINT: Jaundice.  HISTORY OF PRESENT ILLNESS: The patient is a 34 year old male with multiple tattoos and a significant alcohol history who presents with jaundice.  He states that he has had nausea and vomiting several times a day for two weeks, was in the Emergency Room last two weeks ago, but has never had any abdominal pain; and in the last few days his girlfriend has noticed that he is turning yellow and jaundiced, and she looked this up on her telephone and thought that he had neonatal bilirubinemia. He came to the Emergency Room again to have it checked out.   The patient denies any abdominal pain. He is not eating very well due to his vomiting. Of note, he has multiple tattoos, some of which are in evolution, meaning that they have been outlined and not been colored, meaning that they are fairly new in nature. He states that he used to drink 18 beers a day until about a month ago, and now he is drinking two beers a day.   PAST MEDICAL HISTORY: Depression.   PAST SURGICAL HISTORY: None.   ALLERGIES: Penicillin.   MEDICATIONS: Tegretol.   FAMILY HISTORY: Noncontributory.   SOCIAL HISTORY: The patient is currently unemployed. He smokes tobacco and drinks alcohol. He denies IV drug abuse. He has multiple tattoos in evolution.   REVIEW OF SYSTEMS: A 10-system review was performed and negative with the exception of that mentioned in the history of present illness.   PHYSICAL EXAMINATION:  GENERAL: A comfortable-appearing patient with multiple multicolored tattoos over both upper extremities, chest, abdomen, and neck.  VITAL SIGNS: Temperature 98, pulse 71, respirations 18, blood pressure 119/72, 96% room air saturation. Pain scale of 0.   HEENT: Marked scleral icterus.   NECK: No palpable neck nodes.   CHEST: Clear to  auscultation.   CARDIAC: Regular rate and rhythm.   ABDOMEN: Abdomen shows a tattoo in evolution. The black outlines are present, and they have not been completely colored in. The abdomen is nondistended, soft, nontender. Negative Murphy's sign. No peritoneal signs.   EXTREMITIES: Without edema. Calves are nontender.   NEUROLOGIC: Grossly intact.   INTEGUMENT: Marked jaundice.   LABORATORY, DIAGNOSTIC AND RADIOLOGICAL DATA:  Tegretol level is 12.9, normal being under 12.0.  PT, INR is 12.5 and 0.9.  Urinalysis is clean.  Electrolytes show a calcium of 8.3, bilirubin of 10.5, alkaline phosphatase is 778, AST and ALT of 260 and 116, and a serum albumin of 2.4, creatinine 0.96, lipase 165.  White blood cell count is 5.1, hemoglobin and hematocrit of 14 and 41.  An ultrasound demonstrates thickened gallbladder wall with some gallbladder sludge but no pericholecystic fluid. No sonographic Murphy's sign. No ascites.   ASSESSMENT AND PLAN: This is a patient with sludge in his gallbladder and some thickened gallbladder wall, but he has absolutely no abdominal pain and a very significantly elevated bilirubin suggestive of parenchymal disease and not obstructive jaundice. His ducts are not dilated on ultrasound. I discussed with the Emergency Room physician the need to follow this patient in the hospital. I do not believe he will require surgery; but he certainly needs a work-up for other etiologies, be it his Tegretol, viral hepatitis due to tattoo exposure, or alcoholism.    I will be happy to follow the patient while he is in the  hospital.  ____________________________ Adah Salvage. Excell Seltzer, MD rec:cbb D: 02/22/2012 14:35:02 ET T: 02/22/2012 16:03:14 ET JOB#: [id number]  cc: Adah Salvage. Excell Seltzer, MD, <Dictator> Lattie Haw MD ELECTRONICALLY SIGNED 02/23/2012 7:29

## 2015-04-13 NOTE — Consult Note (Signed)
Brief Consult Note: Diagnosis: jaundice.   Patient was seen by consultant.   Consult note dictated.   Recommend further assessment or treatment.   Discussed with Attending MD.   Comments: Jaundice with GB sludge but no abd pain, signif ETOh hx and new tattoos. Will follow but add'l w/u for parenchymal disease needed before GB considered source.  Electronic Signatures: Lattie Hawooper, Cheyan Frees E (MD)  (Signed [phone]-Mar-13 14:22)  Authored: Brief Consult Note   Last Updated: 05-Mar-13 14:22 by Lattie Hawooper, Garlin Batdorf E (MD)

## 2016-05-27 ENCOUNTER — Encounter: Payer: Self-pay | Admitting: Emergency Medicine

## 2016-05-27 ENCOUNTER — Emergency Department
Admission: EM | Admit: 2016-05-27 | Discharge: 2016-05-27 | Disposition: A | Discharge status: Home / Self Care | Payer: Self-pay | Attending: Student | Admitting: Student

## 2016-05-27 ENCOUNTER — Emergency Department: Payer: Self-pay

## 2016-05-27 DIAGNOSIS — F17210 Nicotine dependence, cigarettes, uncomplicated: Secondary | ICD-10-CM | POA: Insufficient documentation

## 2016-05-27 DIAGNOSIS — J40 Bronchitis, not specified as acute or chronic: Secondary | ICD-10-CM | POA: Insufficient documentation

## 2016-05-27 MED ORDER — AZITHROMYCIN 500 MG PO TABS: 500.0000 mg | ORAL_TABLET | Freq: Every day | ORAL | Status: DC

## 2016-05-27 MED ORDER — IPRATROPIUM-ALBUTEROL 0.5-2.5 (3) MG/3ML IN SOLN
3.0000 mL | Freq: Once | RESPIRATORY_TRACT | Status: AC
  Administered 2016-05-27: 3 mL via RESPIRATORY_TRACT
  Filled 2016-05-27: qty 3

## 2016-05-27 MED ORDER — PSEUDOEPH-BROMPHEN-DM 30-2-10 MG/5ML PO SYRP: 5.0000 mL | ORAL_SOLUTION | Freq: Four times a day (QID) | ORAL | Status: DC | PRN

## 2016-05-27 NOTE — ED Provider Notes (Signed)
[facility] Emergency Department Provider Note   ____________________________________________  Time seen: Approximately 11:22 AM  I have reviewed the triage vital signs and the nursing notes.   HISTORY  Chief Complaint Cough and Nasal Congestion    HPI [name] is a 35 y.o. male patient presented today with cough, congestion, cough and low-grade fever. Patient denies dyspnea. Patient also state there is nasal congestionpostnasal drainage. Patient state yesterday he had nausea and diarrhea no vomiting. Patient denies nausea, vomiting, diarrhea today. Patient admits tobacco use. Patient state he used TheraFlu last night with no relief. Patient purchased DayQuil today as noted no improvement. Patient rates his pain discomfort as 8/10. No other palliative measures for this complaint.   History reviewed. No pertinent past medical history.  There are no active problems to display for this patient.   History reviewed. No pertinent past surgical history.  Current Outpatient Rx  Name  Route  Sig  Dispense  Refill  . azithromycin (ZITHROMAX) 500 MG tablet   Oral   Take 1 tablet (500 mg total) by mouth daily. Take 1 tablet daily for 3 days.   3 tablet   0   . brompheniramine-pseudoephedrine-DM 30-2-10 MG/5ML syrup   Oral   Take 5 mLs by mouth 4 (four) times daily as needed.   120 mL   0     Allergies Penicillins  No family history on file.  Social History Social History  Substance Use Topics  . Smoking status: Current Every Day Smoker -- 1.00 packs/day    Types: Cigarettes  . Smokeless tobacco: None  . Alcohol Use: No    Review of Systems Constitutional: No fever/chills Eyes: No visual changes. ENT: No sore throat. Cardiovascular: Denies chest pain. Respiratory: Denies shortness of breath.Productive cough and Rhonchi breath sounds right side. Gastrointestinal: No abdominal pain.  No nausea, no vomiting.  No diarrhea.  No  constipation. Genitourinary: Negative for dysuria. Musculoskeletal: Negative for back pain. Skin: Negative for rash. Neurological: Negative for headaches, focal weakness or numbness. Allergic/Immunilogical: Penicillin   ____________________________________________   PHYSICAL EXAM:  VITAL SIGNS: ED Triage Vitals  Enc Vitals Group     BP 05/27/16 1114 132/85 mmHg     Pulse Rate 05/27/16 1114 70     Resp 05/27/16 1114 18     Temp 05/27/16 1114 99.1 F (37.3 C)     Temp Source 05/27/16 1114 Oral     SpO2 05/27/16 1114 95 %     Weight 05/27/16 1114 190 lb (86.183 kg)     Height 05/27/16 1114 6' (1.829 m)     Head Cir --      Peak Flow --      Pain Score 05/27/16 1115 8     Pain Loc --      Pain Edu? --      Excl. in GC? --     Constitutional: Alert and oriented. Well appearing and in no acute distress. Eyes: Conjunctivae are normal. PERRL. EOMI. Head: Atraumatic. Nose: No congestion/rhinnorhea. Mouth/Throat: Mucous membranes are moist.  Oropharynx non-erythematous. Neck: No stridor.  No cervical spine tenderness to palpation. Hematological/Lymphatic/Immunilogical: No cervical lymphadenopathy. Cardiovascular: Normal rate, regular rhythm. Grossly normal heart sounds.  Good peripheral circulation. Respiratory: Normal respiratory effort.  No retractions. Lungs CTAB. Gastrointestinal: Soft and nontender. No distention. No abdominal bruits. No CVA tenderness. Musculoskeletal: No lower extremity tenderness nor edema.  No joint effusions. Neurologic:  Normal speech and language. No gross focal neurologic deficits are appreciated. No  gait instability. Skin:  Skin is warm, dry and intact. No rash noted. Psychiatric: Mood and affect are normal. Speech and behavior are normal.  ____________________________________________   LABS (all labs ordered are listed, but only abnormal results are displayed)  Labs Reviewed - No data to  display ____________________________________________  EKG   ____________________________________________  RADIOLOGY  No acute findings on chest x-ray ____________________________________________   PROCEDURES  Procedure(s) performed: None  Critical Care performed: No  ____________________________________________   INITIAL IMPRESSION / ASSESSMENT AND PLAN / ED COURSE  Pertinent labs & imaging results that were available during my care of the patient were reviewed by me and considered in my medical decision making (see chart for details). Bronchitis. Patient given discharge care instructions. Patient is a prescription for Zithromax and [place] DM. Patient advised to [facility] this condition persists. ____________________________________________   FINAL CLINICAL IMPRESSION(S) / ED DIAGNOSES  Final diagnoses:  Bronchitis      NEW MEDICATIONS STARTED DURING THIS VISIT:  New Prescriptions   AZITHROMYCIN (ZITHROMAX) 500 MG TABLET    Take 1 tablet (500 mg total) by mouth daily. Take 1 tablet daily for 3 days.   BROMPHENIRAMINE-PSEUDOEPHEDRINE-DM 30-2-10 MG/5ML SYRUP    Take 5 mLs by mouth 4 (four) times daily as needed.     Note:  This document was prepared using Dragon voice recognition software and may include unintentional dictation errors.    Joni Reining, PA-C 05/27/16 1241  Gayla Doss, MD 05/27/16 [phone]

## 2016-05-27 NOTE — ED Notes (Signed)
Patient presents to the ED with cough, congestion, coughing up yellow sputum since Sunday.  Patient states, "I know I shouldn't be smoking".  Patient reports taking theraflu without much improvement.  Patient is speaking in full sentences with no obvious distress.

## 2017-07-09 ENCOUNTER — Emergency Department
Admission: EM | Admit: 2017-07-09 | Discharge: 2017-07-09 | Disposition: A | Discharge status: Home / Self Care | Payer: Self-pay | Attending: Emergency Medicine | Admitting: Emergency Medicine

## 2017-07-09 DIAGNOSIS — J020 Streptococcal pharyngitis: Secondary | ICD-10-CM | POA: Insufficient documentation

## 2017-07-09 DIAGNOSIS — F17210 Nicotine dependence, cigarettes, uncomplicated: Secondary | ICD-10-CM | POA: Insufficient documentation

## 2017-07-09 LAB — POCT RAPID STREP A: STREPTOCOCCUS, GROUP A SCREEN (DIRECT): NEGATIVE

## 2017-07-09 MED ORDER — AZITHROMYCIN 500 MG PO TABS
500.0000 mg | ORAL_TABLET | Freq: Once | ORAL | Status: AC
  Administered 2017-07-09: 500 mg via ORAL
  Filled 2017-07-09: qty 1

## 2017-07-09 MED ORDER — ACETAMINOPHEN 325 MG PO TABS
650.0000 mg | ORAL_TABLET | Freq: Once | ORAL | Status: AC | PRN
  Administered 2017-07-09: 650 mg via ORAL
  Filled 2017-07-09: qty 2

## 2017-07-09 MED ORDER — AZITHROMYCIN 250 MG PO TABS: ORAL_TABLET | ORAL | 0 refills | Status: DC

## 2017-07-09 NOTE — ED Triage Notes (Signed)
Patient reports symptoms began yesterday - sore throat, fever and headache.

## 2017-07-09 NOTE — ED Provider Notes (Signed)
[facility] Emergency Department Provider Note  ____________________________________________  Time seen: Approximately 10:19 PM  I have reviewed the triage vital signs and the nursing notes.   HISTORY  Chief Complaint Sore Throat and Fever    HPI Garrett Hayes is a 36 y.o. male presenting to the emergency department with pharyngitis, headache, abdominal discomfort, anterior neck pain and fever for the past 2 days. Patient has been managing his own secretions and tolerating fluids by mouth. Patient has tried chicken noodle soup. He denies rhinorrhea, congestion and nonproductive cough.   No past medical history on file.  There are no active problems to display for this patient.   No past surgical history on file.  Prior to Admission medications   Medication Sig Start Date End Date Taking? Authorizing Provider  azithromycin (ZITHROMAX) 250 MG tablet Take one tablet daily for the next four days. 07/09/17   Orvil FeilWoods, Ural Acree M, PA-C  brompheniramine-pseudoephedrine-DM 30-2-10 MG/5ML syrup Take 5 mLs by mouth 4 (four) times daily as needed. 05/27/16   Joni ReiningSmith, Ronald K, PA-C    Allergies Penicillins  No family history on file.  Social History Social History  Substance Use Topics  . Smoking status: Current Every Day Smoker    Packs/day: 1.00    Types: Cigarettes  . Smokeless tobacco: Not on file  . Alcohol use No     Review of Systems  Constitutional: Patient has fever Eyes: No visual changes. No discharge ENT: Patient has pharyngitis Cardiovascular: no chest pain. Respiratory: no cough. No SOB. Gastrointestinal: No abdominal pain.  No nausea, no vomiting.  No diarrhea.  No constipation. Genitourinary: Negative for dysuria. No hematuria Musculoskeletal: Negative for musculoskeletal pain. Skin: Negative for rash, abrasions, lacerations, ecchymosis. Neurological: Patient has headache, no focal weakness or  numbness.   ____________________________________________   PHYSICAL EXAM:  VITAL SIGNS: ED Triage Vitals  Enc Vitals Group     BP 07/09/17 2124 128/80     Pulse Rate 07/09/17 2124 86     Resp 07/09/17 2124 18     Temp 07/09/17 2124 (!) 100.5 F (38.1 C)     Temp Source 07/09/17 2124 Oral     SpO2 07/09/17 2124 99 %     Weight 07/09/17 2124 187 lb (84.8 kg)     Height 07/09/17 2124 6' (1.829 m)     Head Circumference --      Peak Flow --      Pain Score 07/09/17 2123 9     Pain Loc --      Pain Edu? --      Excl. in GC? --      Constitutional: Alert and oriented. Well appearing and in no acute distress. Eyes: Conjunctivae are normal. PERRL. EOMI. Head: Atraumatic. ENT:      Ears: Tympanic membranes are pearly bilaterally.      Nose: No congestion/rhinnorhea.      Mouth/Throat: Mucous membranes are moist. Posterior pharynx is erythematous with tonsillar hypertrophy and exudate. Neck: No stridor. Full range of motion. Hematological/Lymphatic/Immunilogical: Palpable cervical lymphadenopathy. Cardiovascular: Normal rate, regular rhythm. Normal S1 and S2.  Good peripheral circulation. Respiratory: Normal respiratory effort without tachypnea or retractions. Lungs CTAB. Good air entry to the bases with no decreased or absent breath sounds. Skin:  Skin is warm, dry and intact. No rash noted. Psychiatric: Mood and affect are normal. Speech and behavior are normal. Patient exhibits appropriate insight and judgement.   ____________________________________________   LABS (all labs ordered are listed, but only abnormal  results are displayed)  Labs Reviewed  CULTURE, GROUP A STREP [facility])  POCT RAPID STREP A   ____________________________________________  EKG   ____________________________________________  RADIOLOGY   No results found.  ____________________________________________    PROCEDURES  Procedure(s) performed:    Procedures    Medications   azithromycin (ZITHROMAX) tablet 500 mg (not administered)  acetaminophen (TYLENOL) tablet 650 mg (650 mg Oral Given 07/09/17 2130)     ____________________________________________   INITIAL IMPRESSION / ASSESSMENT AND PLAN / ED COURSE  Pertinent labs & imaging results that were available during my care of the patient were reviewed by me and considered in my medical decision making (see chart for details).  Review of the [place] CSRS was performed in accordance of the [facility] prior to dispensing any controlled drugs.    Assessment and Plan Strep pharyngitis Patient's diagnosis is consistent with strep pharyngitis. Patient will be discharged home with prescriptions for azithromycin given penicillin allergy. Patient is to follow up with primary care as needed. Patient is given ED precautions to return to the ED for any worsening or new symptoms.     ____________________________________________  FINAL CLINICAL IMPRESSION(S) / ED DIAGNOSES  Final diagnoses:  Strep throat      NEW MEDICATIONS STARTED DURING THIS VISIT:  New Prescriptions   AZITHROMYCIN (ZITHROMAX) 250 MG TABLET    Take one tablet daily for the next four days.        This chart was dictated using voice recognition software/Dragon. Despite best efforts to proofread, errors can occur which can change the meaning. Any change was purely unintentional.    Orvil Feil, PA-C 07/09/17 2223    Jeanmarie Plant, MD 07/09/17 [phone]

## 2017-07-11 LAB — CULTURE, GROUP A STREP (THRC)

## 2018-02-07 ENCOUNTER — Emergency Department
Admission: EM | Admit: 2018-02-07 | Discharge: 2018-02-07 | Disposition: A | Discharge status: Home / Self Care | Payer: Self-pay | Attending: Emergency Medicine | Admitting: Emergency Medicine

## 2018-02-07 ENCOUNTER — Emergency Department: Payer: Self-pay

## 2018-02-07 ENCOUNTER — Other Ambulatory Visit: Payer: Self-pay

## 2018-02-07 ENCOUNTER — Encounter: Payer: Self-pay | Admitting: Emergency Medicine

## 2018-02-07 DIAGNOSIS — B349 Viral infection, unspecified: Secondary | ICD-10-CM | POA: Insufficient documentation

## 2018-02-07 DIAGNOSIS — R509 Fever, unspecified: Secondary | ICD-10-CM | POA: Insufficient documentation

## 2018-02-07 DIAGNOSIS — F17210 Nicotine dependence, cigarettes, uncomplicated: Secondary | ICD-10-CM | POA: Insufficient documentation

## 2018-02-07 DIAGNOSIS — R05 Cough: Secondary | ICD-10-CM | POA: Insufficient documentation

## 2018-02-07 MED ORDER — GUAIFENESIN-CODEINE 100-10 MG/5ML PO SYRP: 5.0000 mL | ORAL_SOLUTION | Freq: Three times a day (TID) | ORAL | 0 refills | Status: DC | PRN

## 2018-02-07 MED ORDER — CETIRIZINE HCL 10 MG PO CAPS: 10.0000 mg | ORAL_CAPSULE | Freq: Every day | ORAL | 3 refills | Status: AC

## 2018-02-07 NOTE — ED Triage Notes (Signed)
Here for cough, runny nose, sneezing, body aches, subjective fever. Did have minimal vomiting, last yesterday. [employer] in [employer] so reports around a lot or people.  VSS. NAD.

## 2018-02-07 NOTE — ED Notes (Signed)
See triage note  Presents with body aches,subjective fever and cough  States sx's started about 3-4 days ago  Low grade fever noted on arrival

## 2018-02-07 NOTE — ED Notes (Signed)
First Nurse Note:  Patient complaining of cough and cold symptoms.  Given a mask to wear.

## 2018-02-07 NOTE — ED Provider Notes (Signed)
[facility] Emergency Department Provider Note  ____________________________________________  Time seen: Approximately 11:14 AM  I have reviewed the triage vital signs and the nursing notes.   HISTORY  Chief Complaint flu like symptoms   HPI Garrett Hayes is a 37 y.o. male who presents to the emergency department for treatment and evaluation of body aches, fever, and cough.  Symptoms started 4 days ago.  He has taken Mucinex without relief.  He had an episode of vomiting yesterday but then was able to tolerate food and fluids last night and this morning.  History reviewed. No pertinent past medical history.  There are no active problems to display for this patient.   History reviewed. No pertinent surgical history.  Prior to Admission medications   Medication Sig Start Date End Date Taking? Authorizing Provider  azithromycin (ZITHROMAX) 250 MG tablet Take one tablet daily for the next four days. 07/09/17   Orvil Feil, PA-C  brompheniramine-pseudoephedrine-DM 30-2-10 MG/5ML syrup Take 5 mLs by mouth 4 (four) times daily as needed. 05/27/16   Joni Reining, PA-C  Cetirizine HCl 10 MG CAPS Take 1 capsule (10 mg total) by mouth daily. 02/07/18   Zakariye Nee B, FNP  guaiFENesin-codeine (ROBITUSSIN AC) 100-10 MG/5ML syrup Take 5 mLs by mouth 3 (three) times daily as needed for cough. 02/07/18   Chinita Pester, FNP    Allergies Penicillins  History reviewed. No pertinent family history.  Social History Social History   Tobacco Use  . Smoking status: Current Every Day Smoker    Packs/day: 1.00    Types: Cigarettes  . Smokeless tobacco: Never Used  Substance Use Topics  . Alcohol use: No  . Drug use: Not on file    Review of Systems Constitutional: Positive for fever/chills ENT: Negative for sore throat. Cardiovascular: Denies chest pain. Respiratory: Negative for shortness of breath.  Positive for cough. Gastrointestinal: Positive nausea,  positive for vomiting.  Negative for diarrhea.  Musculoskeletal: Positive for body aches Skin: Negative for rash. Neurological: Negative for headaches ____________________________________________   PHYSICAL EXAM:  VITAL SIGNS: ED Triage Vitals  Enc Vitals Group     BP 02/07/18 0821 (!) 141/82     Pulse Rate 02/07/18 0821 81     Resp 02/07/18 0821 18     Temp 02/07/18 0821 100 F (37.8 C)     Temp Source 02/07/18 0821 Oral     SpO2 02/07/18 0821 97 %     Weight 02/07/18 0819 185 lb (83.9 kg)     Height 02/07/18 0819 5\' 11"  (1.803 m)     Head Circumference --      Peak Flow --      Pain Score 02/07/18 0819 9     Pain Loc --      Pain Edu? --      Excl. in GC? --     Constitutional: Alert and oriented.  Acutely ill appearing and in no acute distress. Eyes: Conjunctivae are normal. EOMI. Ears: Bilateral tympanic membranes appear injected but are without erythema.  No loss of light reflex. Nose: Sinus congestion noted; no rhinnorhea. Mouth/Throat: Mucous membranes are moist.  Oropharynx erythematous. Tonsils 1+ without exudate. Neck: No stridor.  Lymphatic: No cervical lymphadenopathy. Cardiovascular: Normal rate, regular rhythm. Good peripheral circulation. Respiratory: Normal respiratory effort.  No retractions.  Scattered rhonchi. Gastrointestinal: Soft and nontender.  Musculoskeletal: FROM x 4 extremities.  Neurologic:  Normal speech and language.  Skin:  Skin is warm, dry and intact. No  rash noted. Psychiatric: Mood and affect are normal. Speech and behavior are normal.  ____________________________________________   LABS (all labs ordered are listed, but only abnormal results are displayed)  Labs Reviewed - No data to display ____________________________________________  EKG  Not indicated ____________________________________________  RADIOLOGY  X-ray shows peribronchial thickening but is otherwise negative for acute cardiopulmonary abnormality. I, Kem Boroughsari  Tereso Unangst, personally viewed and evaluated these images (plain radiographs) as part of my medical decision making, as well as reviewing the written report by the radiologist.  ____________________________________________   PROCEDURES  Procedure(s) performed: None  Critical Care performed: No ____________________________________________   INITIAL IMPRESSION / ASSESSMENT AND PLAN / ED COURSE  37 year old male presenting to the emergency department for evaluation and treatment of symptoms most consistent with viral URI.  He was given a prescription for cetirizine and Robitussin-AC to help with the primary care provider for symptoms that are not improving over the week.  He was instructed to return to the emergency department for symptoms of change or worsen if unable to schedule an appointment.  Medications - No data to display  ED Discharge Orders        Ordered    guaiFENesin-codeine (ROBITUSSIN AC) 100-10 MG/5ML syrup  3 times daily PRN     02/07/18 0935    Cetirizine HCl 10 MG CAPS  Daily     02/07/18 0935      Pertinent labs & imaging results that were available during my care of the patient were reviewed by me and considered in my medical decision making (see chart for details).    If controlled substance prescribed during this visit, 12 month history viewed on the [place] prior to issuing an initial prescription for Schedule II or III opiod. ____________________________________________   FINAL CLINICAL IMPRESSION(S) / ED DIAGNOSES  Final diagnoses:  Acute viral syndrome    Note:  This document was prepared using Dragon voice recognition software and may include unintentional dictation errors.     Chinita Pesterriplett, Solita Macadam B, FNP 02/07/18 1118    Emily FilbertWilliams, Jonathan E, MD 02/07/18 1140

## 2020-01-15 ENCOUNTER — Encounter: Payer: Self-pay | Admitting: Emergency Medicine

## 2020-01-15 ENCOUNTER — Emergency Department
Admission: EM | Admit: 2020-01-15 | Discharge: 2020-01-15 | Disposition: A | Discharge status: Home / Self Care | Payer: Self-pay | Attending: Emergency Medicine | Admitting: Emergency Medicine

## 2020-01-15 ENCOUNTER — Other Ambulatory Visit: Payer: Self-pay

## 2020-01-15 DIAGNOSIS — Y939 Activity, unspecified: Secondary | ICD-10-CM | POA: Insufficient documentation

## 2020-01-15 DIAGNOSIS — Y929 Unspecified place or not applicable: Secondary | ICD-10-CM | POA: Insufficient documentation

## 2020-01-15 DIAGNOSIS — M5416 Radiculopathy, lumbar region: Secondary | ICD-10-CM | POA: Insufficient documentation

## 2020-01-15 DIAGNOSIS — Z79899 Other long term (current) drug therapy: Secondary | ICD-10-CM | POA: Insufficient documentation

## 2020-01-15 DIAGNOSIS — Y999 Unspecified external cause status: Secondary | ICD-10-CM | POA: Insufficient documentation

## 2020-01-15 DIAGNOSIS — S39012A Strain of muscle, fascia and tendon of lower back, initial encounter: Secondary | ICD-10-CM | POA: Insufficient documentation

## 2020-01-15 DIAGNOSIS — F17210 Nicotine dependence, cigarettes, uncomplicated: Secondary | ICD-10-CM | POA: Insufficient documentation

## 2020-01-15 DIAGNOSIS — X58XXXA Exposure to other specified factors, initial encounter: Secondary | ICD-10-CM | POA: Insufficient documentation

## 2020-01-15 MED ORDER — KETOROLAC TROMETHAMINE 30 MG/ML IJ SOLN
30.0000 mg | Freq: Once | INTRAMUSCULAR | Status: AC
  Administered 2020-01-15: 30 mg via INTRAMUSCULAR
  Filled 2020-01-15: qty 1

## 2020-01-15 MED ORDER — LIDOCAINE 5 % EX PTCH: 1.0000 | MEDICATED_PATCH | CUTANEOUS | 0 refills | Status: AC

## 2020-01-15 MED ORDER — IBUPROFEN 600 MG PO TABS: 600.0000 mg | ORAL_TABLET | Freq: Four times a day (QID) | ORAL | 0 refills | Status: AC | PRN

## 2020-01-15 MED ORDER — CYCLOBENZAPRINE HCL 5 MG PO TABS: ORAL_TABLET | ORAL | 0 refills | Status: AC

## 2020-01-15 NOTE — ED Triage Notes (Signed)
Presents with lower back pain  States pain started about 1 week ago w/o injury   States pain is now moving into right leg  Ambulates with slight limp

## 2020-01-15 NOTE — ED Provider Notes (Signed)
[facility] Emergency Department Provider Note  ____________________________________________  Time seen: Approximately 2:34 PM  I have reviewed the triage vital signs and the nursing notes.   HISTORY  Chief Complaint Back Pain    HPI Garrett Hayes is a 39 y.o. male that presents to the emergency department for evaluation of low back pain for 1 week that radiates into the front of right leg today. No bowel or bladder incontinence or saddle anesthesia. No IV drug use.  Patient has been taking anti-inflammatories for pain.  His sister, who is a nurse told him that it seemed like a muscle strain.  No trauma.  No fever, abdominal pain, urinary symptoms, weakness.   History reviewed. No pertinent past medical history.  There are no problems to display for this patient.   History reviewed. No pertinent surgical history.  Prior to Admission medications   Medication Sig Start Date End Date Taking? Authorizing Provider  Cetirizine HCl 10 MG CAPS Take 1 capsule (10 mg total) by mouth daily. 02/07/18   Sherrie George B, FNP  cyclobenzaprine (FLEXERIL) 5 MG tablet Take 1-2 tablets 3 times daily as needed 01/15/20   Laban Emperor, PA-C  ibuprofen (ADVIL) 600 MG tablet Take 1 tablet (600 mg total) by mouth every 6 (six) hours as needed. 01/15/20   Laban Emperor, PA-C  lidocaine (LIDODERM) 5 % Place 1 patch onto the skin daily. Remove & Discard patch within 12 hours or as directed by MD 01/15/20   Laban Emperor, PA-C    Allergies Penicillins  No family history on file.  Social History Social History   Tobacco Use  . Smoking status: Current Every Day Smoker    Packs/day: 1.00    Types: Cigarettes  . Smokeless tobacco: Never Used  Substance Use Topics  . Alcohol use: No  . Drug use: Not on file     Review of Systems  Constitutional: No fever/chills Respiratory: No SOB. Gastrointestinal: No abdominal pain.  No nausea, no vomiting.  Musculoskeletal:  Positive for back pain. Skin: Negative for rash, abrasions, lacerations, ecchymosis. Neurological: Negative for headaches, numbness or tingling   ____________________________________________   PHYSICAL EXAM:  VITAL SIGNS: ED Triage Vitals  Enc Vitals Group     BP 01/15/20 1408 137/89     Pulse Rate 01/15/20 1408 (!) 101     Resp 01/15/20 1408 18     Temp 01/15/20 1408 98.4 F (36.9 C)     Temp Source 01/15/20 1408 Oral     SpO2 01/15/20 1408 97 %     Weight 01/15/20 1411 235 lb (106.6 kg)     Height 01/15/20 1411 6' (1.829 m)     Head Circumference --      Peak Flow --      Pain Score 01/15/20 1411 10     Pain Loc --      Pain Edu? --      Excl. in [place]? --      Constitutional: Alert and oriented. Well appearing and in no acute distress. Eyes: Conjunctivae are normal. PERRL. EOMI. Head: Atraumatic. ENT:      Ears:      Nose: No congestion/rhinnorhea.      Mouth/Throat: Mucous membranes are moist.  Neck: No stridor.   Cardiovascular: Normal rate, regular rhythm.  Good peripheral circulation. Respiratory: Normal respiratory effort without tachypnea or retractions. Lungs CTAB. Good air entry to the bases with no decreased or absent breath sounds. Gastrointestinal: Bowel sounds 4 quadrants. Soft and nontender  to palpation. No guarding or rigidity. No palpable masses. No distention.  Musculoskeletal: Full range of motion to all extremities. No gross deformities appreciated. Tenderness to palpation to right SI joint.  Strength equal in lower extremities bilaterally.  Antalgic gait.  Weightbearing. Neurologic:  Normal speech and language. No gross focal neurologic deficits are appreciated.  Skin:  Skin is warm, dry and intact. No rash noted. Psychiatric: Mood and affect are normal. Speech and behavior are normal. Patient exhibits appropriate insight and judgement.   ____________________________________________   LABS (all labs ordered are listed, but only abnormal results  are displayed)  Labs Reviewed - No data to display ____________________________________________  EKG   ____________________________________________  RADIOLOGY   No results found.  ____________________________________________    PROCEDURES  Procedure(s) performed:    Procedures    Medications  ketorolac (TORADOL) 30 MG/ML injection 30 mg (30 mg Intramuscular Given 01/15/20 1522)     ____________________________________________   INITIAL IMPRESSION / ASSESSMENT AND PLAN / ED COURSE  Pertinent labs & imaging results that were available during my care of the patient were reviewed by me and considered in my medical decision making (see chart for details).  Review of the [place] CSRS was performed in accordance of the [facility] prior to dispensing any controlled drugs.   Patient's diagnosis is consistent with lumbar strain and lumbar radiculopathy.  Vital signs and exam are reassuring.  Patient was given one dose of IM Toradol for back pain.  Patient will be discharged home with prescriptions for Flexeril, Motrin, Lidoderm. Patient is to follow up with primary care as directed. Patient is given ED precautions to return to the ED for any worsening or new symptoms.   Garrett Hayes was evaluated in Emergency Department on 01/15/2020 for the symptoms described in the history of present illness. He was evaluated in the context of the global COVID-19 pandemic, which necessitated consideration that the patient might be at risk for infection with the SARS-CoV-2 virus that causes COVID-19. Institutional protocols and algorithms that pertain to the evaluation of patients at risk for COVID-19 are in a state of rapid change based on information released by regulatory bodies including the CDC and federal and state organizations. These policies and algorithms were followed during the patient's care in the ED.  ____________________________________________  FINAL CLINICAL IMPRESSION(S) / ED  DIAGNOSES  Final diagnoses:  Strain of lumbar region, initial encounter  Lumbar radiculopathy      NEW MEDICATIONS STARTED DURING THIS VISIT:  ED Discharge Orders         Ordered    cyclobenzaprine (FLEXERIL) 5 MG tablet     01/15/20 1514    ibuprofen (ADVIL) 600 MG tablet  Every 6 hours PRN     01/15/20 1514    lidocaine (LIDODERM) 5 %  Every 24 hours     01/15/20 1514              This chart was dictated using voice recognition software/Dragon. Despite best efforts to proofread, errors can occur which can change the meaning. Any change was purely unintentional.    Enid Derry, PA-C 01/15/20 1546    Shaune Pollack, MD 01/16/20 [phone]

## 2020-04-07 ENCOUNTER — Ambulatory Visit: Payer: Self-pay

## 2021-05-15 ENCOUNTER — Other Ambulatory Visit: Payer: Self-pay

## 2021-05-15 ENCOUNTER — Emergency Department
Admission: EM | Admit: 2021-05-15 | Discharge: 2021-05-15 | Disposition: A | Discharge status: Home / Self Care | Payer: Self-pay | Attending: Emergency Medicine | Admitting: Emergency Medicine

## 2021-05-15 ENCOUNTER — Encounter: Payer: Self-pay | Admitting: Emergency Medicine

## 2021-05-15 DIAGNOSIS — Z5321 Procedure and treatment not carried out due to patient leaving prior to being seen by health care provider: Secondary | ICD-10-CM | POA: Insufficient documentation

## 2021-05-15 DIAGNOSIS — F10129 Alcohol abuse with intoxication, unspecified: Secondary | ICD-10-CM | POA: Insufficient documentation

## 2021-05-15 LAB — COMPREHENSIVE METABOLIC PANEL
ALT: 28 U/L (ref 0–44)
AST: 29 U/L (ref 15–41)
Albumin: 4.4 g/dL (ref 3.5–5.0)
Alkaline Phosphatase: 79 U/L (ref 38–126)
Anion gap: 10 (ref 5–15)
BUN: 15 mg/dL (ref 6–20)
CO2: 22 mmol/L (ref 22–32)
Calcium: 8.8 mg/dL — ABNORMAL LOW (ref 8.9–10.3)
Chloride: 107 mmol/L (ref 98–111)
Creatinine, Ser: 0.99 mg/dL (ref 0.61–1.24)
GFR, Estimated: >60 mL/min (ref >60)
Glucose, Bld: 109 mg/dL — ABNORMAL HIGH (ref 70–99)
Potassium: 3.6 mmol/L (ref 3.5–5.1)
Sodium: 139 mmol/L (ref 135–145)
Total Bilirubin: 0.6 mg/dL (ref 0.3–1.2)
Total Protein: 7.4 g/dL (ref 6.5–8.1)

## 2021-05-15 LAB — CBC
HCT: 47.4 % (ref 39.0–52.0)
Hemoglobin: 15.9 g/dL (ref 13.0–17.0)
MCH: 30.5 pg (ref 26.0–34.0)
MCHC: 33.5 g/dL (ref 30.0–36.0)
MCV: 91 fL (ref 80.0–100.0)
Platelets: 255 10*3/uL (ref 150–400)
RBC: 5.21 MIL/uL (ref 4.22–5.81)
RDW: 13.8 % (ref 11.5–15.5)
WBC: 9.2 10*3/uL (ref 4.0–10.5)
nRBC: 0 % (ref 0.0–0.2)

## 2021-05-15 NOTE — ED Triage Notes (Addendum)
Pt to ER states he went on a drinking binge last night after breaking up with his "wife".  Pt states has had DTs in past and was worried it would happen again.  Pt denies headache, n/v, tremors, etc.

## 2022-07-13 ENCOUNTER — Encounter: Payer: Self-pay | Admitting: Emergency Medicine

## 2022-07-13 ENCOUNTER — Emergency Department
Admission: EM | Admit: 2022-07-13 | Discharge: 2022-07-14 | Disposition: A | Discharge status: Home / Self Care | Payer: Self-pay | Attending: Emergency Medicine | Admitting: Emergency Medicine

## 2022-07-13 DIAGNOSIS — Y908 Blood alcohol level of 240 mg/100 ml or more: Secondary | ICD-10-CM | POA: Insufficient documentation

## 2022-07-13 DIAGNOSIS — F10920 Alcohol use, unspecified with intoxication, uncomplicated: Secondary | ICD-10-CM | POA: Insufficient documentation

## 2022-07-13 DIAGNOSIS — Z20822 Contact with and (suspected) exposure to covid-19: Secondary | ICD-10-CM | POA: Insufficient documentation

## 2022-07-13 DIAGNOSIS — F10221 Alcohol dependence with intoxication delirium: Secondary | ICD-10-CM | POA: Diagnosis present

## 2022-07-13 LAB — COMPREHENSIVE METABOLIC PANEL
ALT: 17 U/L (ref 0–44)
AST: 22 U/L (ref 15–41)
Albumin: 4.7 g/dL (ref 3.5–5.0)
Alkaline Phosphatase: 75 U/L (ref 38–126)
Anion gap: 9 (ref 5–15)
BUN: 7 mg/dL (ref 6–20)
CO2: 24 mmol/L (ref 22–32)
Calcium: 8.9 mg/dL (ref 8.9–10.3)
Chloride: 107 mmol/L (ref 98–111)
Creatinine, Ser: 0.94 mg/dL (ref 0.61–1.24)
GFR, Estimated: >60 mL/min (ref >60)
Glucose, Bld: 99 mg/dL (ref 70–99)
Potassium: 3.6 mmol/L (ref 3.5–5.1)
Sodium: 140 mmol/L (ref 135–145)
Total Bilirubin: 0.7 mg/dL (ref 0.3–1.2)
Total Protein: 7.9 g/dL (ref 6.5–8.1)

## 2022-07-13 LAB — RESP PANEL BY RT-PCR (FLU A&B, COVID) ARPGX2
Influenza A by PCR: NEGATIVE
Influenza B by PCR: NEGATIVE
SARS Coronavirus 2 by RT PCR: NEGATIVE

## 2022-07-13 LAB — URINE DRUG SCREEN, QUALITATIVE (ARMC ONLY)
Amphetamines, Ur Screen: NOT DETECTED
Barbiturates, Ur Screen: NOT DETECTED
Benzodiazepine, Ur Scrn: NOT DETECTED
Cannabinoid 50 Ng, Ur ~~LOC~~: NOT DETECTED
Cocaine Metabolite,Ur ~~LOC~~: NOT DETECTED
MDMA (Ecstasy)Ur Screen: NOT DETECTED
Methadone Scn, Ur: NOT DETECTED
Opiate, Ur Screen: NOT DETECTED
Phencyclidine (PCP) Ur S: NOT DETECTED
Tricyclic, Ur Screen: NOT DETECTED

## 2022-07-13 LAB — CBC
HCT: 50.9 % (ref 39.0–52.0)
Hemoglobin: 16.9 g/dL (ref 13.0–17.0)
MCH: 30.3 pg (ref 26.0–34.0)
MCHC: 33.2 g/dL (ref 30.0–36.0)
MCV: 91.4 fL (ref 80.0–100.0)
Platelets: 260 10*3/uL (ref 150–400)
RBC: 5.57 MIL/uL (ref 4.22–5.81)
RDW: 14.7 % (ref 11.5–15.5)
WBC: 7.3 10*3/uL (ref 4.0–10.5)
nRBC: 0 % (ref 0.0–0.2)

## 2022-07-13 LAB — ACETAMINOPHEN LEVEL: Acetaminophen (Tylenol), Serum: <10 ug/mL — ABNORMAL LOW (ref 10–30)

## 2022-07-13 LAB — SALICYLATE LEVEL: Salicylate Lvl: <7 mg/dL — ABNORMAL LOW (ref 7.0–30.0)

## 2022-07-13 LAB — ETHANOL: Alcohol, Ethyl (B): 298 mg/dL — ABNORMAL HIGH (ref <10)

## 2022-07-13 MED ORDER — LORAZEPAM 2 MG PO TABS
0.0000 mg | ORAL_TABLET | Freq: Four times a day (QID) | ORAL | Status: DC
  Administered 2022-07-14: 1 mg via ORAL
  Filled 2022-07-13: qty 1

## 2022-07-13 MED ORDER — LORAZEPAM 2 MG PO TABS: 0.0000 mg | ORAL_TABLET | Freq: Two times a day (BID) | ORAL | Status: DC

## 2022-07-13 MED ORDER — LORAZEPAM 2 MG/ML IJ SOLN: 0.0000 mg | Freq: Two times a day (BID) | INTRAMUSCULAR | Status: DC

## 2022-07-13 MED ORDER — THIAMINE HCL 100 MG PO TABS
100.0000 mg | ORAL_TABLET | Freq: Every day | ORAL | Status: DC
  Administered 2022-07-14: 100 mg via ORAL
  Filled 2022-07-13: qty 1

## 2022-07-13 MED ORDER — LORAZEPAM 2 MG/ML IJ SOLN: 0.0000 mg | Freq: Four times a day (QID) | INTRAMUSCULAR | Status: DC

## 2022-07-13 MED ORDER — THIAMINE HCL 100 MG/ML IJ SOLN
100.0000 mg | Freq: Every day | INTRAMUSCULAR | Status: DC
  Filled 2022-07-13: qty 1
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
## 2022-07-13 NOTE — ED Notes (Signed)
Pt here under IVC with [employer] officer Ashworth, with warrant in possession for forensic blood draw.  Pt refused to sign consent, but gave verbal permission for blood draw.  Tourniquet applied to left upper arm. Left AC cleansed with betadine wipe provided in kit officers provided.  Kit was sealed and opened in front of myself and pt.  2 gray top tubes obtained, and handed over to officer Ashworth.  Tourniquet removed, site dressed with sterile gauze and bleeding controlled.  Pt awaiting with [employer] in family wait for room to become available.  Pt cooperative during blood draw.

## 2022-07-13 NOTE — ED Notes (Signed)
Pt dressed out in family room with this Clinical research associate and [employer] officer.    Pt belongings:   Investment banker, corporate with writing  Black underwear Black shoes  Purple wallet  White cell phone

## 2022-07-14 DIAGNOSIS — F10221 Alcohol dependence with intoxication delirium: Secondary | ICD-10-CM | POA: Diagnosis present

## 2022-07-14 NOTE — ED Notes (Signed)
IVC  CONSULT  DONE  PENDING  PLACEMENT 

## 2022-07-14 NOTE — ED Notes (Signed)
Patient is pleasant, no signs of distress, Patient denies Si/hi or avh, states " I had 13 months clean and messed up about 3 three weeks ago, and that he needs to get back in [place], Patient is cooperative, and safe.

## 2022-07-14 NOTE — Consult Note (Signed)
[facility] Psych ED Progress Note  07/14/2022 12:18 PM Garrett Hayes  MRN:  [id number]   Method of visit?: Face to Face   Subjective: "No, I do not have any thoughts of suicide." Denies homicidal ideations, auditory or visual hallucinations or paranoia. Requesting discharge home. He is clear, coherent, speaking in linear sentences.   Patient reassessed this morning. He is speaks in clear, linear. coherent, sentences. He has spoken to [employer] representative this morning and has agreed to start IOP this Friday for his alcohol misuse. Patient no longer meets criteria for involuntary committment. IVC released.      Collateral from patient's wife, Michaele Offer [phone]: Separated a month agog and he relapsed on alcohol. Sent texts to wife that he wanted to die.  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 No pertinent past medical history. History reviewed. No pertinent surgical history. Family History: History reviewed. No pertinent family history.  Family Psychiatric  History: none reported Social History:  Social History   Substance and Sexual Activity  Alcohol Use Yes     Social History   Substance and Sexual Activity  Drug Use Yes    Social History   Socioeconomic History   Marital status: Single    Spouse name: Not on file   Number of children: Not on file   Years of education: Not on file   Highest education level: Not on file  Occupational History   Not on file  Tobacco Use   Smoking status: Every Day    Packs/day: 1.00    Types: Cigarettes   Smokeless tobacco: Never  Substance and Sexual Activity   Alcohol use: Yes   Drug use: Yes   Sexual activity: Not on file  Other Topics Concern    Not on file  Social History Narrative   Not on file   Social Determinants of Health   Financial Resource Strain: Not on file  Food Insecurity: Not on file  Transportation Needs: Not on file  Physical Activity: Not on file  Stress: Not on file  Social Connections: Not on file    Sleep: Good  Appetite:  Good  Current Medications: Current Facility-Administered Medications  Medication Dose Route Frequency Provider Last Rate Last Admin   LORazepam (ATIVAN) injection 0-4 mg  0-4 mg Intravenous Q6H Georga Hacking, MD       Or   LORazepam (ATIVAN) tablet 0-4 mg  0-4 mg Oral Q6H Georga Hacking, MD   1 mg at 07/14/22 1046   [START ON 07/16/2022] LORazepam (ATIVAN) injection 0-4 mg  0-4 mg Intravenous Q12H Georga Hacking, MD       Or   Melene Muller ON 07/16/2022] LORazepam (ATIVAN) tablet 0-4 mg  0-4 mg Oral Q12H Georga Hacking, MD       thiamine tablet 100 mg  100 mg Oral Daily Georga Hacking, MD   100 mg at 07/14/22 1044   Or   thiamine (B-1) injection 100 mg  100 mg Intravenous Daily Georga Hacking, MD       Current Outpatient Medications  Medication Sig Dispense Refill   Cetirizine HCl 10 MG CAPS Take 1 capsule (10 mg total) by mouth daily. (Patient not taking: Reported on 07/13/2022) 30 capsule 3   cyclobenzaprine (  FLEXERIL) 5 MG tablet Take 1-2 tablets 3 times daily as needed (Patient not taking: Reported on 07/13/2022) 20 tablet 0   ibuprofen (ADVIL) 600 MG tablet Take 1 tablet (600 mg total) by mouth every 6 (six) hours as needed. (Patient not taking: Reported on 07/13/2022) 30 tablet 0   lidocaine (LIDODERM) 5 % Place 1 patch onto the skin daily. Remove & Discard patch within 12 hours or as directed by MD (Patient not taking: Reported on 07/13/2022) 30 patch 0    Lab Results:  Results for orders placed or performed during the hospital encounter of 07/13/22 (from the past 48 hour(s))  Comprehensive metabolic panel     Status: None   Collection Time: 07/13/22  8:30  PM  Result Value Ref Range   Sodium 140 135 - 145 mmol/L   Potassium 3.6 3.5 - 5.1 mmol/L   Chloride 107 98 - 111 mmol/L   CO2 24 22 - 32 mmol/L   Glucose, Bld 99 70 - 99 mg/dL    Comment: Glucose reference range applies only to samples taken after fasting for at least 8 hours.   BUN 7 6 - 20 mg/dL   Creatinine, Ser 4.58 0.61 - 1.24 mg/dL   Calcium 8.9 8.9 - 09.9 mg/dL   Total Protein 7.9 6.5 - 8.1 g/dL   Albumin 4.7 3.5 - 5.0 g/dL   AST 22 15 - 41 U/L   ALT 17 0 - 44 U/L   Alkaline Phosphatase 75 38 - 126 U/L   Total Bilirubin 0.7 0.3 - 1.2 mg/dL   GFR, Estimated >83 >38 mL/min    Comment: (NOTE) Calculated using the CKD-EPI Creatinine Equation (2021)    Anion gap 9 5 - 15    Comment: Performed at [facility], [address] [place], [place] [address]  Ethanol     Status: Abnormal   Collection Time: 07/13/22  8:30 PM  Result Value Ref Range   Alcohol, Ethyl (B) 298 (H) <10 mg/dL    Comment: (NOTE) Lowest detectable limit for serum alcohol is 10 mg/dL.  For medical purposes only. Performed at [facility], [address] [place], [place] [address]   Salicylate level     Status: Abnormal   Collection Time: 07/13/22  8:30 PM  Result Value Ref Range   Salicylate Lvl <7.0 (L) 7.0 - 30.0 mg/dL    Comment: Performed at [facility], [address] [place], [place] [address]  Acetaminophen level     Status: Abnormal   Collection Time: 07/13/22  8:30 PM  Result Value Ref Range   Acetaminophen (Tylenol), Serum <10 (L) 10 - 30 ug/mL    Comment: (NOTE) Therapeutic concentrations vary significantly. A range of 10-30 ug/mL  may be an effective concentration for many patients. However, some  are best treated at concentrations outside of this range. Acetaminophen concentrations >150 ug/mL at 4 hours after ingestion  and >50 ug/mL at 12 hours after ingestion are often associated with  toxic reactions.  Performed at [facility], [address] [place], [place] [address]   cbc     Status: None   Collection Time: 07/13/22  8:30 PM  Result Value Ref Range   WBC 7.3 4.0 - 10.5 K/uL   RBC 5.57 4.22 - 5.81 MIL/uL   Hemoglobin 16.9 13.0 - 17.0 g/dL   HCT 97.3 53.2 - 99.2 %   MCV 91.4 80.0 - 100.0 fL   MCH 30.3 26.0 - 34.0 pg  MCHC 33.2 30.0 - 36.0 g/dL   RDW 16.114.7 09.611.5 - 04.515.5 %   Platelets 260 150 - 400 K/uL   nRBC 0.0 0.0 - 0.2 %    Comment: Performed at [facility], [address] [place], [place] [address]  Urine Drug Screen, Qualitative     Status: None   Collection Time: 07/13/22  8:30 PM  Result Value Ref Range   Tricyclic, Ur Screen NONE DETECTED NONE DETECTED   Amphetamines, Ur Screen NONE DETECTED NONE DETECTED   MDMA (Ecstasy)Ur Screen NONE DETECTED NONE DETECTED   Cocaine Metabolite,Ur [place] NONE DETECTED NONE DETECTED   Opiate, Ur Screen NONE DETECTED NONE DETECTED   Phencyclidine (PCP) Ur S NONE DETECTED NONE DETECTED   Cannabinoid 50 Ng, Ur [place] NONE DETECTED NONE DETECTED   Barbiturates, Ur Screen NONE DETECTED NONE DETECTED   Benzodiazepine, Ur Scrn NONE DETECTED NONE DETECTED   Methadone Scn, Ur NONE DETECTED NONE DETECTED    Comment: (NOTE) Tricyclics + metabolites, urine    Cutoff 1000 ng/mL Amphetamines + metabolites, urine  Cutoff 1000 ng/mL MDMA (Ecstasy), urine              Cutoff 500 ng/mL Cocaine Metabolite, urine          Cutoff 300 ng/mL Opiate + metabolites, urine        Cutoff 300 ng/mL Phencyclidine (PCP), urine         Cutoff 25 ng/mL Cannabinoid, urine                 Cutoff 50 ng/mL Barbiturates + metabolites, urine  Cutoff 200 ng/mL Benzodiazepine, urine              Cutoff 200 ng/mL Methadone, urine                   Cutoff 300 ng/mL  The urine drug screen provides only a preliminary, unconfirmed analytical test result and should not be used for non-medical purposes. Clinical consideration and professional judgment should be applied to any positive drug screen  result due to possible interfering substances. A more specific alternate chemical method must be used in order to obtain a confirmed analytical result. Gas chromatography / mass spectrometry (GC/MS) is the preferred confirm atory method. Performed at [facility], [address] [place], [place] [address]   Resp Panel by RT-PCR (Flu A&B, Covid) Anterior Nasal Swab     Status: None   Collection Time: 07/13/22 10:15 PM   Specimen: Anterior Nasal Swab  Result Value Ref Range   SARS Coronavirus 2 by RT PCR NEGATIVE NEGATIVE    Comment: (NOTE) SARS-CoV-2 target nucleic acids are NOT DETECTED.  The SARS-CoV-2 RNA is generally detectable in upper respiratory specimens during the acute phase of infection. The lowest concentration of SARS-CoV-2 viral copies this assay can detect is 138 copies/mL. A negative result does not preclude SARS-Cov-2 infection and should not be used as the sole basis for treatment or other patient management decisions. A negative result may occur with  improper specimen collection/handling, submission of specimen other than nasopharyngeal swab, presence of viral mutation(s) within the areas targeted by this assay, and inadequate number of viral copies(<138 copies/mL). A negative result must be combined with clinical observations, patient history, and epidemiological information. The expected result is Negative.  Fact Sheet for Patients:  BloggerCourse.comhttps://www.fda.gov/media/152166/download  Fact Sheet for Healthcare Providers:  SeriousBroker.ithttps://www.fda.gov/media/152162/download  This test is no t yet approved or cleared by the [place] FDA and  has  been authorized for detection and/or diagnosis of SARS-CoV-2 by FDA under an Emergency Use Authorization (EUA). This EUA will remain  in effect (meaning this test can be used) for the duration of the COVID-19 declaration under Section 564(b)(1) of the Act, 21 U.S.C.section 360bbb-3(b)(1), unless the authorization is  terminated  or revoked sooner.       Influenza A by PCR NEGATIVE NEGATIVE   Influenza B by PCR NEGATIVE NEGATIVE    Comment: (NOTE) The Xpert Xpress SARS-CoV-2/FLU/RSV plus assay is intended as an aid in the diagnosis of influenza from Nasopharyngeal swab specimens and should not be used as a sole basis for treatment. Nasal washings and aspirates are unacceptable for Xpert Xpress SARS-CoV-2/FLU/RSV testing.  Fact Sheet for Patients: BloggerCourse.com  Fact Sheet for Healthcare Providers: SeriousBroker.it  This test is not yet approved or cleared by the [place] FDA and has been authorized for detection and/or diagnosis of SARS-CoV-2 by FDA under an Emergency Use Authorization (EUA). This EUA will remain in effect (meaning this test can be used) for the duration of the COVID-19 declaration under Section 564(b)(1) of the Act, 21 U.S.C. section 360bbb-3(b)(1), unless the authorization is terminated or revoked.  Performed at [facility], [address] [place], [place] [address]     Blood Alcohol level:  Lab Results  Component Value Date   ETH 298 (H) 07/13/2022    Physical Findings: AIMS:  , ,  ,  ,    CIWA:  CIWA-Ar Total: 0 COWS:     Musculoskeletal: Strength & Muscle Tone: within normal limits Gait & Station: normal Patient leans: N/A  Psychiatric Specialty Exam:  Presentation  General Appearance: Appropriate for Environment  Eye Contact:Good  Speech:Clear and Coherent  Speech Volume:Normal  Handedness:Right   Mood and Affect  Mood:Euthymic  Affect:Appropriate; Congruent   Thought Process  Thought Processes:Coherent  Descriptions of Associations:Intact  Orientation:Full (Time, Place and Person)  Thought Content:WDL  History of Schizophrenia/Schizoaffective disorder:No  Duration of Psychotic Symptoms:No data recorded Hallucinations:Hallucinations: None  Ideas of  Reference:None  Suicidal Thoughts:Suicidal Thoughts: No  Homicidal Thoughts:Homicidal Thoughts: No   Sensorium  Memory:Immediate Good; Recent Good  Judgment:Good (at this time)  Insight:Poor   Executive Functions  Concentration:Good  Attention Span:Good  Recall:Good  Fund of Knowledge:Good  Language:Good   Psychomotor Activity  Psychomotor Activity:Psychomotor Activity: Normal   Assets  Assets:Communication Skills; Desire for Improvement; Housing; Resilience; Social Support; Physical Health   Sleep  Sleep:Sleep: Good    Physical Exam: Physical Exam Vitals and nursing note reviewed.  HENT:     Head: Normocephalic.  Eyes:     General:        Right eye: No discharge.        Left eye: No discharge.  Cardiovascular:     Rate and Rhythm: Normal rate.  Pulmonary:     Effort: Pulmonary effort is normal.  Musculoskeletal:        General: Normal range of motion.  Neurological:     Mental Status: He is alert.  Psychiatric:        Behavior: Behavior normal.    Review of Systems  HENT: Negative.    Eyes: Negative.   Respiratory: Negative.    Musculoskeletal: Negative.   Neurological: Negative.   Psychiatric/Behavioral:  Positive for substance abuse. Negative for depression, hallucinations, memory loss and suicidal ideas. The patient is not nervous/anxious and does not have insomnia.    Blood pressure 132/77, pulse 86, temperature 97.7 F (36.5 C), temperature source Oral,  resp. rate 16, SpO2 97 %. There is no height or weight on file to calculate BMI.  Treatment Plan Summary: Plan Patient no longer meets criteria for IVC and does not meet criteria for inpatient psychiatric hospitalization. He has spoken with representative from [facility] and he will participate in their IOP program for alcohol use. He will follow up with them on 07/16/22. Reviewed with EDP  Vanetta Mulders, NP 07/14/2022, 12:18 PM

## 2022-07-14 NOTE — BH Assessment (Signed)
Comprehensive Clinical Assessment (CCA) Note  07/14/2022 Vanessa Alesi [id number]  Chief Complaint: Patient is 41 year old male presenting to [facility] ED under IVC. Per triage note Pt arrived via [employer] under custody with IVC affidavit and search warrant for forensic blood draw. Per affidavit, pt was intoxicated driving vehicle while texting wife who he is currently separated from communicating suicidal ideations. Per affidavit, text messages read, "I am done. I want to die. I'll end my life my own way. I love you let's die together and smile one more time." Pt calm and cooperative in triage but pt stumbling with gait and words. Pt impulsive with thoughts and words. During assessment patient appears alert and oriented x4, calm and cooperative. Patient reports "I drank a little too much." Patient does not recall sending any text messages. Patient denies SI/HI/AH/VH.  Per Pysc NP Elenore Paddy patient to be reassessed  Chief Complaint  Patient presents with   Alcohol Intoxication   Visit Diagnosis: Alcohol Intoxication    CCA Screening, Triage and Referral (STR)  Patient Reported Information How did you hear about [place]? Legal System  Referral name: No data recorded Referral phone number: No data recorded  Whom do you see for routine medical problems? No data recorded Practice/Facility Name: No data recorded Practice/Facility Phone Number: No data recorded Name of Contact: No data recorded Contact Number: No data recorded Contact Fax Number: No data recorded Prescriber Name: No data recorded Prescriber Address (if known): No data recorded  What Is the Reason for Your Visit/Call Today? Patient presents under IVC due to alcohol intoxication  How Long Has This Been Causing You Problems? > than 6 months  What Do You Feel Would Help You the Most Today? No data recorded  Have You Recently Been in Any Inpatient Treatment (Hospital/Detox/Crisis Center/28-Day Program)? No data  recorded Name/Location of Program/Hospital:No data recorded How Long Were You There? No data recorded When Were You Discharged? No data recorded  Have You Ever Received Services From [facility] Before? No data recorded Who Do You See at [facility]? No data recorded  Have You Recently Had Any Thoughts About Hurting Yourself? No  Are You Planning to Commit Suicide/Harm Yourself At This time? No   Have you Recently Had Thoughts About Hurting Someone Karolee Ohs? No  Explanation: No data recorded  Have You Used Any Alcohol or Drugs in the Past 24 Hours? Yes  How Long Ago Did You Use Drugs or Alcohol? No data recorded What Did You Use and How Much? Alcohol, Unknown amounts   Do You Currently Have a Therapist/Psychiatrist? No  Name of Therapist/Psychiatrist: No data recorded  Have You Been Recently Discharged From Any Office Practice or Programs? No  Explanation of Discharge From Practice/Program: No data recorded    CCA Screening Triage Referral Assessment Type of Contact: Face-to-Face  Is this Initial or Reassessment? No data recorded Date Telepsych consult ordered in [facility]:  No data recorded Time Telepsych consult ordered in [facility]:  No data recorded  Patient Reported Information Reviewed? No data recorded Patient Left Without Being Seen? No data recorded Reason for Not Completing Assessment: No data recorded  Collateral Involvement: No data recorded  Does Patient Have a Court Appointed Legal Guardian? No data recorded Name and Contact of Legal Guardian: No data recorded If Minor and Not Living with Parent(s), Who has Custody? No data recorded Is CPS involved or ever been involved? Never  Is APS involved or ever been involved? Never   Patient Determined To Be  At Risk for Harm To Self or Others Based on Review of Patient Reported Information or Presenting Complaint? No  Method: No data recorded Availability of Means: No data recorded Intent: No data recorded Notification  Required: No data recorded Additional Information for Danger to Others Potential: No data recorded Additional Comments for Danger to Others Potential: No data recorded Are There Guns or Other Weapons in Your Home? No data recorded Types of Guns/Weapons: No data recorded Are These Weapons Safely Secured?                            No data recorded Who Could Verify You Are Able To Have These Secured: No data recorded Do You Have any Outstanding Charges, Pending Court Dates, Parole/Probation? No data recorded Contacted To Inform of Risk of Harm To Self or Others: No data recorded  Location of Assessment: [facility] ED   Does Patient Present under Involuntary Commitment? Yes  IVC Papers Initial File Date: 07/14/22   [place] of Residence: [place]   Patient Currently Receiving the Following Services: No data recorded  Determination of Need: Emergent (2 hours)   Options For Referral: No data recorded    CCA Biopsychosocial Intake/Chief Complaint:  No data recorded Current Symptoms/Problems: No data recorded  Patient Reported Schizophrenia/Schizoaffective Diagnosis in Past: No   Strengths: Patient is able to communicate  Preferences: No data recorded Abilities: No data recorded  Type of Services Patient Feels are Needed: No data recorded  Initial Clinical Notes/Concerns: No data recorded  Mental Health Symptoms Depression:   None   Duration of Depressive symptoms: No data recorded  Mania:   None   Anxiety:    None   Psychosis:   None   Duration of Psychotic symptoms: No data recorded  Trauma:   None   Obsessions:   None   Compulsions:   None   Inattention:   None   Hyperactivity/Impulsivity:   None   Oppositional/Defiant Behaviors:   None   Emotional Irregularity:   None   Other Mood/Personality Symptoms:  No data recorded   Mental Status Exam Appearance and self-care  Stature:   Average   Weight:   Average weight   Clothing:    Disheveled   Grooming:   Normal   Cosmetic use:   None   Posture/gait:   Normal   Motor activity:   Not Remarkable   Sensorium  Attention:   Normal   Concentration:   Normal   Orientation:   X5   Recall/memory:   Normal   Affect and Mood  Affect:   Appropriate   Mood:   Other (Comment)   Relating  Eye contact:   Normal   Facial expression:   Responsive   Attitude toward examiner:   Cooperative   Thought and Language  Speech flow:  Clear and Coherent   Thought content:   Appropriate to Mood and Circumstances   Preoccupation:   None   Hallucinations:   None   Organization:  No data recorded  [employer] of Knowledge:   Fair   Intelligence:   Average   Abstraction:   Normal   Judgement:   Fair   Dance movement psychotherapist:   Realistic   Insight:   Fair   Decision Making:   Impulsive   Social Functioning  Social Maturity:   Responsible   Social Judgement:   Normal   Stress  Stressors:   Relationship  Coping Ability:   Normal   Skill Deficits:   None   Supports:   Family     Religion: Religion/Spirituality Are You A Religious Person?: No  Leisure/Recreation: Leisure / Recreation Do You Have Hobbies?: No  Exercise/Diet: Exercise/Diet Do You Exercise?: No Have You Gained or Lost A Significant Amount of Weight in the Past Six Months?: No Do You Follow a Special Diet?: No Do You Have Any Trouble Sleeping?: No   CCA Employment/Education Employment/Work Situation: Employment / Work Situation Employment Situation: Unemployed Patient's Job has Been Impacted by Current Illness: No Has Patient ever Been in Equities trader?: No  Education: Education Is Patient Currently Attending School?: No Did You Have An Individualized Education Program (IIEP): No Did You Have Any Difficulty At [employer]?: No Patient's Education Has Been Impacted by Current Illness: No   CCA Family/Childhood History Family and  Relationship History: Family history Marital status: Separated Separated, when?: Unknown What types of issues is patient dealing with in the relationship?: Patient reports that he and his wife are separated Does patient have children?: No  Childhood History:  Childhood History Did patient suffer any verbal/emotional/physical/sexual abuse as a child?: No Did patient suffer from severe childhood neglect?: No Has patient ever been sexually abused/assaulted/raped as an adolescent or adult?: No Was the patient ever a victim of a crime or a disaster?: No Witnessed domestic violence?: No Has patient been affected by domestic violence as an adult?: No  Child/Adolescent Assessment:     CCA Substance Use Alcohol/Drug Use: Alcohol / Drug Use Pain Medications: See MAR Prescriptions: See MAR Over the Counter: See MAR History of alcohol / drug use?: Yes Substance #1 Name of Substance 1: Alcohol                       ASAM's:  Six Dimensions of Multidimensional Assessment  Dimension 1:  Acute Intoxication and/or Withdrawal Potential:      Dimension 2:  Biomedical Conditions and Complications:      Dimension 3:  Emotional, Behavioral, or Cognitive Conditions and Complications:     Dimension 4:  Readiness to Change:     Dimension 5:  Relapse, Continued use, or Continued Problem Potential:     Dimension 6:  Recovery/Living Environment:     ASAM Severity Score:    ASAM Recommended Level of Treatment:     Substance use Disorder (SUD)    Recommendations for Services/Supports/Treatments:    DSM5 Diagnoses: Patient Active Problem List   Diagnosis Date Noted   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

## 2022-07-14 NOTE — ED Notes (Signed)
Patient transferred from ED to [facility] room 6 after screening for contraband. Report received from Selena Batten, RN including Situation, Background, Assessment and Recommendations. Pt oriented to unit including Q15 minute rounds as well as the security cameras for their protection. Patient is alert and oriented, warm and dry in no acute distress. Patient denies SI, HI, and AVH. Pt. Encouraged to let this nurse know if needs arise.

## 2022-07-14 NOTE — Consult Note (Signed)
[facility] Consult   Reason for Consult: Alcohol Intoxication Referring Physician: Dr. 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 I just wanted some attention." Garrett Hayes is a 41 y.o. male patient presented to [facility] ED via law enforcement under involuntary commitment status (IVC). Per the ED triage nurses note, Pt arrived via [employer] under custody with IVC affidavit and search warrant for forensic blood draw. Per affidavit, pt was intoxicated driving vehicle while texting wife who he is currently separated from communicating suicidal ideations. Per affidavit, text messages read, "I am done. I want to die. I'll end my life my own way. I love you let's die together and smile one more time." Pt calm and cooperative in triage but pt stumbling with gait and words. Pt impulsive with thoughts and words.  The patient's BAL is 298 mg/dl. The patient shared that he does not have a psychiatric diagnosis. He stated his behavior tonight was because he was trying to get some attention from his wife of five years whom they are separated. He shared that he was not trying to harm himself or anyone else. He stated he drank a little too much.    This provider saw The patient face-to-face; the chart was reviewed, and consulted with Dr. Karma Greaser on 07/14/2022 due to the patient's care. It was discussed with the EDP that the patient remained under observation overnight and will be reassessed in the a.m. to determine if he meets the criteria for psychiatric inpatient admission; he could be discharged home.  On evaluation, the patient is alert and oriented x 4, calm, cooperative, and mood-congruent with affect. The patient does not  appear to be responding to internal or external stimuli. Neither is the patient presenting with any delusional thinking. The patient denies auditory or visual hallucinations. The patient denies any suicidal, homicidal, or self-harm ideations. The patient is not presenting with any psychotic or paranoid behaviors. During an encounter with the patient, he could answer questions appropriately.  HPI: Per Dr. Starleen Blue, Garrett Hayes is a 41 y.o. male   with past medical history of prior alcohol use disorder in remission who presents under IVC.  Per IVC paperwork patient was pulled over found to be heavily intoxicated with text messages to his wife saying that he is ready to die left eye together.  Patient does admit to drinking beer tonight.  Says that he used to be an alcoholic and go to AA and had been in remission until he broke up with his wife recently.  Denies daily alcohol use.  He denies suicidal ideation currently.  Denies other drug use no other complaints.  Past Psychiatric History: History reviewed. No pertinent past psychiatric history  Risk to Self:   Risk to Others:   Prior Inpatient Therapy:   Prior Outpatient Therapy:    Past Medical History: History reviewed. No pertinent past medical history. History reviewed. No pertinent surgical history. Family History: History reviewed. No pertinent family history. Family Psychiatric  History:  Social History:  Social History   Substance and Sexual Activity  Alcohol Use Yes     Social History   Substance and Sexual Activity  Drug Use Yes    Social History   Socioeconomic History   Marital status: Single  Spouse name: Not on file   Number of children: Not on file   Years of education: Not on file   Highest education level: Not on file  Occupational History   Not on file  Tobacco Use   Smoking status: Every Day    Packs/day: 1.00    Types: Cigarettes   Smokeless tobacco: Never  Substance and Sexual Activity   Alcohol use:  Yes   Drug use: Yes   Sexual activity: Not on file  Other Topics Concern   Not on file  Social History Narrative   Not on file   Social Determinants of Health   Financial Resource Strain: Not on file  Food Insecurity: Not on file  Transportation Needs: Not on file  Physical Activity: Not on file  Stress: Not on file  Social Connections: Not on file   Additional Social History:    Allergies:   Allergies  Allergen Reactions   Penicillins Anaphylaxis    Labs:  Results for orders placed or performed during the hospital encounter of 07/13/22 (from the past 48 hour(s))  Comprehensive metabolic panel     Status: None   Collection Time: 07/13/22  8:30 PM  Result Value Ref Range   Sodium 140 135 - 145 mmol/L   Potassium 3.6 3.5 - 5.1 mmol/L   Chloride 107 98 - 111 mmol/L   CO2 24 22 - 32 mmol/L   Glucose, Bld 99 70 - 99 mg/dL    Comment: Glucose reference range applies only to samples taken after fasting for at least 8 hours.   BUN 7 6 - 20 mg/dL   Creatinine, Ser 0.94 0.61 - 1.24 mg/dL   Calcium 8.9 8.9 - 10.3 mg/dL   Total Protein 7.9 6.5 - 8.1 g/dL   Albumin 4.7 3.5 - 5.0 g/dL   AST 22 15 - 41 U/L   ALT 17 0 - 44 U/L   Alkaline Phosphatase 75 38 - 126 U/L   Total Bilirubin 0.7 0.3 - 1.2 mg/dL   GFR, Estimated >60 >60 mL/min    Comment: (NOTE) Calculated using the CKD-EPI Creatinine Equation (2021)    Anion gap 9 5 - 15    Comment: Performed at [facility], [address] [place], [place] [address]  Ethanol     Status: Abnormal   Collection Time: 07/13/22  8:30 PM  Result Value Ref Range   Alcohol, Ethyl (B) 298 (H) <10 mg/dL    Comment: (NOTE) Lowest detectable limit for serum alcohol is 10 mg/dL.  For medical purposes only. Performed at [facility], [address] [place], [place] [id number]   Salicylate level     Status: Abnormal   Collection Time: 07/13/22  8:30 PM  Result Value Ref Range   Salicylate Lvl [id number] (L) 7.0 - 30.0 mg/dL     Comment: Performed at [facility], [address] [place], [place] [address]  Acetaminophen level     Status: Abnormal   Collection Time: 07/13/22  8:30 PM  Result Value Ref Range   Acetaminophen (Tylenol), Serum <10 (L) 10 - 30 ug/mL    Comment: (NOTE) Therapeutic concentrations vary significantly. A range of 10-30 ug/mL  may be an effective concentration for many patients. However, some  are best treated at concentrations outside of this range. Acetaminophen concentrations >150 ug/mL at 4 hours after ingestion  and >50 ug/mL at 12 hours after ingestion are often associated with  toxic reactions.  Performed at [facility], [phone] [place], [place] [address]   cbc     Status: None   Collection Time: 07/13/22  8:30 PM  Result Value Ref Range   WBC 7.3 4.0 - 10.5 K/uL   RBC 5.57 4.22 - 5.81 MIL/uL   Hemoglobin 16.9 13.0 - 17.0 g/dL   HCT 50.9 39.0 - 52.0 %   MCV 91.4 80.0 - 100.0 fL   MCH 30.3 26.0 - 34.0 pg   MCHC 33.2 30.0 - 36.0 g/dL   RDW 14.7 11.5 - 15.5 %   Platelets 260 150 - 400 K/uL   nRBC 0.0 0.0 - 0.2 %    Comment: Performed at [facility], [address], [place], [place] [address]  Urine Drug Screen, Qualitative     Status: None   Collection Time: 07/13/22  8:30 PM  Result Value Ref Range   Tricyclic, Ur Screen NONE DETECTED NONE DETECTED   Amphetamines, Ur Screen NONE DETECTED NONE DETECTED   MDMA (Ecstasy)Ur Screen NONE DETECTED NONE DETECTED   Cocaine Metabolite,Ur [place] NONE DETECTED NONE DETECTED   Opiate, Ur Screen NONE DETECTED NONE DETECTED   Phencyclidine (PCP) Ur S NONE DETECTED NONE DETECTED   Cannabinoid 50 Ng, Ur [place] NONE DETECTED NONE DETECTED   Barbiturates, Ur Screen NONE DETECTED NONE DETECTED   Benzodiazepine, Ur Scrn NONE DETECTED NONE DETECTED   Methadone Scn, Ur NONE DETECTED NONE DETECTED    Comment: (NOTE) Tricyclics + metabolites, urine    Cutoff 1000 ng/mL Amphetamines + metabolites, urine   Cutoff 1000 ng/mL MDMA (Ecstasy), urine              Cutoff 500 ng/mL Cocaine Metabolite, urine          Cutoff 300 ng/mL Opiate + metabolites, urine        Cutoff 300 ng/mL Phencyclidine (PCP), urine         Cutoff 25 ng/mL Cannabinoid, urine                 Cutoff 50 ng/mL Barbiturates + metabolites, urine  Cutoff 200 ng/mL Benzodiazepine, urine              Cutoff 200 ng/mL Methadone, urine                   Cutoff 300 ng/mL  The urine drug screen provides only a preliminary, unconfirmed analytical test result and should not be used for non-medical purposes. Clinical consideration and professional judgment should be applied to any positive drug screen result due to possible interfering substances. A more specific alternate chemical method must be used in order to obtain a confirmed analytical result. Gas chromatography / mass spectrometry (GC/MS) is the preferred confirm atory method. Performed at [facility], [address] [place], [place] [address]   Resp Panel by RT-PCR (Flu A&B, Covid) Anterior Nasal Swab     Status: None   Collection Time: 07/13/22 10:15 PM   Specimen: Anterior Nasal Swab  Result Value Ref Range   SARS Coronavirus 2 by RT PCR NEGATIVE NEGATIVE    Comment: (NOTE) SARS-CoV-2 target nucleic acids are NOT DETECTED.  The SARS-CoV-2 RNA is generally detectable in upper respiratory specimens during the acute phase of infection. The lowest concentration of SARS-CoV-2 viral copies this assay can detect is 138 copies/mL. A negative result does not preclude SARS-Cov-2 infection and should not be used as the sole basis for treatment or other patient management decisions. A negative result may occur with  improper specimen  collection/handling, submission of specimen other than nasopharyngeal swab, presence of viral mutation(s) within the areas targeted by this assay, and inadequate number of viral copies(<138 copies/mL). A negative result must be  combined with clinical observations, patient history, and epidemiological information. The expected result is Negative.  Fact Sheet for Patients:  BloggerCourse.com  Fact Sheet for Healthcare Providers:  SeriousBroker.it  This test is no t yet approved or cleared by the [place] FDA and  has been authorized for detection and/or diagnosis of SARS-CoV-2 by FDA under an Emergency Use Authorization (EUA). This EUA will remain  in effect (meaning this test can be used) for the duration of the COVID-19 declaration under Section 564(b)(1) of the Act, 21 U.S.C.section 360bbb-3(b)(1), unless the authorization is terminated  or revoked sooner.       Influenza A by PCR NEGATIVE NEGATIVE   Influenza B by PCR NEGATIVE NEGATIVE    Comment: (NOTE) The Xpert Xpress SARS-CoV-2/FLU/RSV plus assay is intended as an aid in the diagnosis of influenza from Nasopharyngeal swab specimens and should not be used as a sole basis for treatment. Nasal washings and aspirates are unacceptable for Xpert Xpress SARS-CoV-2/FLU/RSV testing.  Fact Sheet for Patients: BloggerCourse.com  Fact Sheet for Healthcare Providers: SeriousBroker.it  This test is not yet approved or cleared by the [place] FDA and has been authorized for detection and/or diagnosis of SARS-CoV-2 by FDA under an Emergency Use Authorization (EUA). This EUA will remain in effect (meaning this test can be used) for the duration of the COVID-19 declaration under Section 564(b)(1) of the Act, 21 U.S.C. section 360bbb-3(b)(1), unless the authorization is terminated or revoked.  Performed at [facility], [address], [place], [place] [address]     Current Facility-Administered Medications  Medication Dose Route Frequency Provider Last Rate Last Admin   LORazepam (ATIVAN) injection 0-4 mg  0-4 mg Intravenous Q6H Georga Hacking, MD       Or   LORazepam (ATIVAN) tablet 0-4 mg  0-4 mg Oral Q6H Georga Hacking, MD       [START ON 07/16/2022] LORazepam (ATIVAN) injection 0-4 mg  0-4 mg Intravenous Q12H Georga Hacking, MD       Or   Melene Muller ON 07/16/2022] LORazepam (ATIVAN) tablet 0-4 mg  0-4 mg Oral Q12H Georga Hacking, MD       thiamine tablet 100 mg  100 mg Oral Daily Georga Hacking, MD       Or   thiamine (B-1) injection 100 mg  100 mg Intravenous Daily Georga Hacking, MD       Current Outpatient Medications  Medication Sig Dispense Refill   Cetirizine HCl 10 MG CAPS Take 1 capsule (10 mg total) by mouth daily. (Patient not taking: Reported on 07/13/2022) 30 capsule 3   cyclobenzaprine (FLEXERIL) 5 MG tablet Take 1-2 tablets 3 times daily as needed (Patient not taking: Reported on 07/13/2022) 20 tablet 0   ibuprofen (ADVIL) 600 MG tablet Take 1 tablet (600 mg total) by mouth every 6 (six) hours as needed. (Patient not taking: Reported on 07/13/2022) 30 tablet 0   lidocaine (LIDODERM) 5 % Place 1 patch onto the skin daily. Remove & Discard patch within 12 hours or as directed by MD (Patient not taking: Reported on 07/13/2022) 30 patch 0    Musculoskeletal: Strength & Muscle Tone: within normal limits Gait & Station: normal Patient leans: N/A Psychiatric Specialty Exam:  Presentation  General Appearance: Disheveled  Eye Contact:Fleeting  Speech:Clear and Coherent  Speech Volume:Normal  Handedness:Right   Mood and Affect  Mood:Dysphoric  Affect:Constricted; Inappropriate   Thought Process  Thought Processes:Goal Directed  Descriptions of Associations:No data recorded Orientation:Full (Time, Place and Person)  Thought Content:Illogical  History of Schizophrenia/Schizoaffective disorder:No data recorded Duration of Psychotic Symptoms:No data recorded Hallucinations:Hallucinations: None  Ideas of Reference:None; Delusions  Suicidal Thoughts:Suicidal Thoughts:  No  Homicidal Thoughts:Homicidal Thoughts: No   Sensorium  Memory:Immediate Fair; Recent Fair; Remote Fair  Judgment:Poor  Insight:Poor   Executive Functions  Concentration:Poor  Attention Span:Poor  Recall:Poor  Fund of Knowledge:Poor  Language:Poor   Psychomotor Activity  Psychomotor Activity:Psychomotor Activity: Normal   Assets  Assets:Communication Skills; Desire for Improvement; Leisure Time; Resilience; Social Support   Sleep  Sleep:Sleep: Good   Physical Exam: Physical Exam Vitals and nursing note reviewed.  Constitutional:      Appearance: Normal appearance. He is normal weight.  HENT:     Head: Normocephalic and atraumatic.     Right Ear: External ear normal.     Left Ear: External ear normal.     Nose: Nose normal.  Cardiovascular:     Rate and Rhythm: Normal rate.     Pulses: Normal pulses.  Pulmonary:     Effort: Pulmonary effort is normal.  Musculoskeletal:        General: Normal range of motion.     Cervical back: Normal range of motion and neck supple.  Neurological:     Mental Status: He is alert. He is disoriented.  Psychiatric:        Attention and Perception: Attention and perception normal.        Mood and Affect: Mood is anxious and depressed. Affect is blunt and inappropriate.        Speech: Speech is delayed and slurred.        Behavior: Behavior is slowed. Behavior is cooperative.        Thought Content: Thought content normal.        Cognition and Memory: Cognition is impaired.        Judgment: Judgment is impulsive and inappropriate.    Review of Systems  Psychiatric/Behavioral:  Positive for depression and substance abuse. The patient is nervous/anxious.   All other systems reviewed and are negative.  Blood pressure 127/73, pulse 90, temperature 97.9 F (36.6 C), temperature source Oral, resp. rate 14, SpO2 95 %. There is no height or weight on file to calculate BMI.  Treatment Plan Summary: Daily contact with  patient to assess and evaluate symptoms and progress in treatment and Plan The patient remained under observation overnight and will be reassessed in the a.m. to determine if he meets the criteria for psychiatric inpatient admission; he could be discharged home.  Disposition: Supportive therapy provided about ongoing stressors. The patient remained under observation overnight and will be reassessed in the a.m. to determine if he meets the criteria for psychiatric inpatient admission; he could be discharged home. Gillermo Murdoch, NP 07/14/2022 2:08 AM

## 2022-07-14 NOTE — ED Notes (Signed)
IVC CONSULT DONE BY NP J THOMPSON/ PENDING PLACEMENT.Marland KitchenMarland Kitchen

## 2022-07-14 NOTE — ED Provider Notes (Signed)
Procedures  Clinical Course as of 07/14/22 1123  Wed Jul 14, 2022  0141 Reassessment in AM per psych [CF]    Clinical Course User Index [CF] Loleta Rose, MD    ----------------------------------------- 11:23 AM on 07/14/2022 -----------------------------------------   Sober. Cleared by psych for DC.    Sharman Cheek, MD 07/14/22 1123

## 2024-03-26 ENCOUNTER — Other Ambulatory Visit: Payer: Self-pay

## 2024-03-26 ENCOUNTER — Emergency Department: Payer: Self-pay

## 2024-03-26 ENCOUNTER — Emergency Department
Admission: EM | Admit: 2024-03-26 | Discharge: 2024-03-26 | Disposition: A | Discharge status: Home / Self Care | Payer: Self-pay | Attending: Emergency Medicine | Admitting: Emergency Medicine

## 2024-03-26 DIAGNOSIS — N5089 Other specified disorders of the male genital organs: Secondary | ICD-10-CM | POA: Insufficient documentation

## 2024-03-26 DIAGNOSIS — N5082 Scrotal pain: Secondary | ICD-10-CM

## 2024-03-26 LAB — URINALYSIS, ROUTINE W REFLEX MICROSCOPIC
Bilirubin Urine: NEGATIVE
Glucose, UA: NEGATIVE mg/dL
Hgb urine dipstick: NEGATIVE
Ketones, ur: NEGATIVE mg/dL
Leukocytes,Ua: NEGATIVE
Nitrite: NEGATIVE
Protein, ur: NEGATIVE mg/dL
Specific Gravity, Urine: 1.027 (ref 1.005–1.030)
pH: 5 (ref 5.0–8.0)

## 2024-03-26 LAB — CHLAMYDIA/NGC RT PCR (ARMC ONLY)
Chlamydia Tr: NOT DETECTED
N gonorrhoeae: NOT DETECTED

## 2024-03-26 NOTE — ED Provider Notes (Signed)
 [facility] Provider Note    Event Date/Time   First MD Initiated Contact with Patient 03/26/24 0845     (approximate)   History   Chief Complaint Groin Swelling   HPI  Garrett Hayes is a 43 y.o. male with past medical history of alcohol abuse who presents to the ED for groin swelling.  Patient reports that he has been dealing with swelling in the right side of his scrotum for about the past year.  He states that it is occasionally painful, but has not been changing significantly in size over the past year.  He states the area feels soft and fluid-filled, has not noticed any lesions to the skin.  He denies any difficulty urinating and has not had any penile discharge.  He is sexually active with his wife, denies any other sexual partners.     Physical Exam   Triage Vital Signs: ED Triage Vitals  Encounter Vitals Group     BP 03/26/24 0828 135/87     Systolic BP Percentile --      Diastolic BP Percentile --      Pulse Rate 03/26/24 0828 66     Resp 03/26/24 0828 18     Temp 03/26/24 0828 98.5 F (36.9 C)     Temp Source 03/26/24 0828 Oral     SpO2 03/26/24 0828 96 %     Weight 03/26/24 0829 190 lb 0.6 oz (86.2 kg)     Height 03/26/24 0829 6' (1.829 m)     Head Circumference --      Peak Flow --      Pain Score 03/26/24 0829 2     Pain Loc --      Pain Education --      Exclude from Growth Chart --     Most recent vital signs: Vitals:   03/26/24 0828  BP: 135/87  Pulse: 66  Resp: 18  Temp: 98.5 F (36.9 C)  SpO2: 96%    Constitutional: Alert and oriented. Eyes: Conjunctivae are normal. Head: Atraumatic. Nose: No congestion/rhinnorhea. Mouth/Throat: Mucous membranes are moist.  Cardiovascular: Normal rate, regular rhythm. Grossly normal heart sounds.  2+ radial pulses bilaterally. Respiratory: Normal respiratory effort.  No retractions. Lungs CTAB. Gastrointestinal: Soft and nontender. No distention. Genitourinary: Right scrotal  edema without associated mass or tenderness, no skin lesions noted.  No testicular tenderness noted bilaterally. Musculoskeletal: No lower extremity tenderness nor edema.  Neurologic:  Normal speech and language. No gross focal neurologic deficits are appreciated.    ED Results / Procedures / Treatments   Labs (all labs ordered are listed, but only abnormal results are displayed) Labs Reviewed  URINALYSIS, ROUTINE W REFLEX MICROSCOPIC - Abnormal; Notable for the following components:      Result Value   Color, Urine YELLOW (*)    APPearance CLEAR (*)    All other components within normal limits  CHLAMYDIA/NGC RT PCR ([facility] ONLY)              RADIOLOGY Scrotal ultrasound reviewed and interpreted by me with no evidence of torsion or epididymitis.  PROCEDURES:  Critical Care performed: No  Procedures   MEDICATIONS ORDERED IN ED: Medications - No data to display   IMPRESSION / MDM / ASSESSMENT AND PLAN / ED COURSE  I reviewed the triage vital signs and the nursing notes.  43 y.o. male with past medical history of alcohol abuse who presents to the ED complaining of swelling of the right side of his scrotum for about the past year.  Patient's presentation is most consistent with acute complicated illness / injury requiring diagnostic workup.  Differential diagnosis includes, but is not limited to, testicular torsion, epididymitis, hydrocele, varicocele.  Patient nontoxic-appearing and in no acute distress, vital signs are unremarkable.  He has edema of the right hemiscrotum but no tenderness or skin lesions noted.  We will further assess with ultrasound, urinalysis is unremarkable.  GC and Chlamydia testing is pending at this time, patient declines pain medication.  Scrotal ultrasound is unremarkable, no evidence of torsion, epididymitis, or other explanation for patient's groin swelling.  Exam not consistent with hernia and patient appropriate for  discharge home with outpatient urology follow-up, was counseled to return to the ED for new or worsening symptoms.  Patient agrees with plan.      FINAL CLINICAL IMPRESSION(S) / ED DIAGNOSES   Final diagnoses:  Swelling of right half of scrotum     Rx / DC Orders   ED Discharge Orders     None        Note:  This document was prepared using Dragon voice recognition software and may include unintentional dictation errors.   Chesley Noon, MD 03/26/24 1115

## 2024-03-26 NOTE — ED Triage Notes (Signed)
 Pt here with genital swelling for months but getting worse. Pt states his scrotum does not look right and is tender. Pt states whenever he takes a bath, his scrotum is severely swollen. Pt denies burning or bleeding with urination.

## 2024-07-06 ENCOUNTER — Encounter: Payer: Self-pay | Admitting: Emergency Medicine

## 2024-07-06 ENCOUNTER — Other Ambulatory Visit: Payer: Self-pay

## 2024-07-06 ENCOUNTER — Emergency Department
Admission: EM | Admit: 2024-07-06 | Discharge: 2024-07-06 | Disposition: A | Discharge status: Home / Self Care | Payer: Self-pay | Attending: Emergency Medicine | Admitting: Emergency Medicine

## 2024-07-06 ENCOUNTER — Emergency Department: Payer: Self-pay

## 2024-07-06 DIAGNOSIS — R051 Acute cough: Secondary | ICD-10-CM

## 2024-07-06 DIAGNOSIS — J029 Acute pharyngitis, unspecified: Secondary | ICD-10-CM

## 2024-07-06 DIAGNOSIS — U071 COVID-19: Secondary | ICD-10-CM | POA: Insufficient documentation

## 2024-07-06 LAB — RESP PANEL BY RT-PCR (RSV, FLU A&B, COVID)  RVPGX2
Influenza A by PCR: NEGATIVE
Influenza B by PCR: NEGATIVE
Resp Syncytial Virus by PCR: NEGATIVE
SARS Coronavirus 2 by RT PCR: POSITIVE — AB

## 2024-07-06 LAB — GROUP A STREP BY PCR: Group A Strep by PCR: NOT DETECTED

## 2024-07-06 MED ORDER — ACETAMINOPHEN 325 MG PO TABS
650.0000 mg | ORAL_TABLET | Freq: Once | ORAL | Status: AC
  Administered 2024-07-06: 650 mg via ORAL
  Filled 2024-07-06: qty 2

## 2024-07-06 MED ORDER — IBUPROFEN 600 MG PO TABS
600.0000 mg | ORAL_TABLET | Freq: Once | ORAL | Status: AC
  Administered 2024-07-06: 600 mg via ORAL
  Filled 2024-07-06: qty 1

## 2024-07-06 MED ORDER — DEXAMETHASONE 6 MG PO TABS
10.0000 mg | ORAL_TABLET | Freq: Once | ORAL | Status: AC
  Administered 2024-07-06: 10 mg via ORAL
  Filled 2024-07-06: qty 1

## 2024-07-06 NOTE — ED Triage Notes (Signed)
 Patient ambulatory to triage with steady gait, without difficulty or distress noted; pt reports x 2 days having sore throat, nonprod cough

## 2024-07-06 NOTE — Discharge Instructions (Addendum)
 You have COVID virus.    Take acetaminophen  650 mg and ibuprofen  400 mg every 6 hours for pain.  Take with food.  Drink plenty of fluids to stay well-hydrated.  Find Pedialyte or similar electrolyte rehydration formulas at your local pharmacy.  Thank you for choosing us  for your health care today!  Please see your primary doctor this week for a follow up appointment.   If you have any new, worsening, or unexpected symptoms call your doctor right away or come back to the emergency department for reevaluation.  It was my pleasure to care for you today.   Ginnie EDISON Cyrena, MD

## 2024-07-06 NOTE — ED Provider Notes (Signed)
 [facility] Provider Note    Event Date/Time   First MD Initiated Contact with Patient 07/06/24 [phone]     (approximate)   History   Sore Throat   HPI  Garrett Hayes is a 43 y.o. male   Past medical history of no significant past medical history, used to drink alcohol but no longer does, presents to the emergency department with sore throat and cough for the last several days.  Family member with similar symptoms.  He denies any GI symptoms like nausea vomiting diarrhea.  No urinary symptoms.      Physical Exam   Triage Vital Signs: ED Triage Vitals  Encounter Vitals Group     BP 07/06/24 0439 (!) 164/100     Girls Systolic BP Percentile --      Girls Diastolic BP Percentile --      Boys Systolic BP Percentile --      Boys Diastolic BP Percentile --      Pulse Rate 07/06/24 0439 76     Resp 07/06/24 0439 18     Temp 07/06/24 0439 98.3 F (36.8 C)     Temp Source 07/06/24 0439 Oral     SpO2 07/06/24 0439 98 %     Weight 07/06/24 0431 218 lb (98.9 kg)     Height 07/06/24 0431 6' (1.829 m)     Head Circumference --      Peak Flow --      Pain Score 07/06/24 0434 7     Pain Loc --      Pain Education --      Exclude from Growth Chart --     Most recent vital signs: Vitals:   07/06/24 0630 07/06/24 0700  BP: 113/84 131/89  Pulse: (!) 50 (!) 53  Resp:    Temp:    SpO2: 95% 97%    General: Awake, no distress.  CV:  Good peripheral perfusion.  Resp:  Normal effort.  Abd:  No distention.  Other:  Awake alert comfortable nontoxic appearing neck supple full range of motion posterior oropharynx appears normal, lungs clear without focality or wheezing.  Soft nontender abdomen.   ED Results / Procedures / Treatments   Labs (all labs ordered are listed, but only abnormal results are displayed) Labs Reviewed  RESP PANEL BY RT-PCR (RSV, FLU A&B, COVID)  [id number] - Abnormal; Notable for the following components:      Result Value   SARS  Coronavirus 2 by RT PCR POSITIVE (*)    All other components within normal limits  GROUP A STREP BY PCR     I ordered and reviewed the above labs they are notable for group A strep is negative.   RADIOLOGY I independently reviewed and interpreted chest x-ray and I see no obvious focality or pneumothorax I also reviewed radiologist's formal read.   PROCEDURES:  Critical Care performed: No  Procedures   MEDICATIONS ORDERED IN ED: Medications  dexamethasone (DECADRON) tablet 10 mg (10 mg Oral Given 07/06/24 0644)  ibuprofen  (ADVIL ) tablet 600 mg (600 mg Oral Given 07/06/24 0644)  acetaminophen  (TYLENOL ) tablet 650 mg (650 mg Oral Given 07/06/24 9356)   IMPRESSION / MDM / ASSESSMENT AND PLAN / ED COURSE  I reviewed the triage vital signs and the nursing notes.                                Patient's presentation  is most consistent with acute complicated illness / injury requiring diagnostic workup.  Differential diagnosis includes, but is not limited to, viral URI, bacterial pneumonia, strep pharyngitis, abscess, deep space neck infection, considered but less likely meningitis sepsis   The patient is on the cardiac monitor to evaluate for evidence of arrhythmia and/or significant heart rate changes.  MDM:   Well-appearing patient nontoxic appearance with viral syndrome cough and sore throat.  No evidence of deep space neck infection, abscess on examination.  No focality on lung exam nor focality on chest x-ray doubt bacterial pneumonia, most likely viral source defer antibiotics.  Looks well so doubt meningitis or sepsis.  Turns out he is COVID-positive.  He has no hypoxemia and appears well enough for discharge.  Gave medications include dexamethasone for sore throat.  Anticipatory guidance given, discharged.      FINAL CLINICAL IMPRESSION(S) / ED DIAGNOSES   Final diagnoses:  Sore throat  Acute cough  COVID     Rx / DC Orders   ED Discharge Orders     None         Note:  This document was prepared using Dragon voice recognition software and may include unintentional dictation errors.    Cyrena Mylar, MD 07/06/24 [phone]
